# Patient Record
Sex: Female | Born: 1985 | Race: Black or African American | Hispanic: No | Marital: Single | Smoking: Never smoker
Health system: Southern US, Community
[De-identification: ages and names within clinical notes are randomized; demographics above are authoritative.]

## PROBLEM LIST (undated history)

## (undated) ENCOUNTER — Inpatient Hospital Stay (HOSPITAL_COMMUNITY): Payer: Self-pay

## (undated) DIAGNOSIS — I8390 Asymptomatic varicose veins of unspecified lower extremity: Secondary | ICD-10-CM

## (undated) DIAGNOSIS — O139 Gestational [pregnancy-induced] hypertension without significant proteinuria, unspecified trimester: Secondary | ICD-10-CM

## (undated) DIAGNOSIS — O09299 Supervision of pregnancy with other poor reproductive or obstetric history, unspecified trimester: Secondary | ICD-10-CM

## (undated) DIAGNOSIS — R569 Unspecified convulsions: Secondary | ICD-10-CM

## (undated) HISTORY — DX: Supervision of pregnancy with other poor reproductive or obstetric history, unspecified trimester: O09.299

---
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
## 2007-05-07 ENCOUNTER — Encounter (INDEPENDENT_AMBULATORY_CARE_PROVIDER_SITE_OTHER): Payer: Self-pay | Admitting: Nurse Practitioner

## 2007-05-15 ENCOUNTER — Encounter (INDEPENDENT_AMBULATORY_CARE_PROVIDER_SITE_OTHER): Payer: Self-pay | Admitting: Nurse Practitioner

## 2007-05-17 ENCOUNTER — Encounter: Payer: Self-pay | Admitting: Nurse Practitioner

## 2007-06-18 ENCOUNTER — Encounter (INDEPENDENT_AMBULATORY_CARE_PROVIDER_SITE_OTHER): Payer: Self-pay | Admitting: Nurse Practitioner

## 2009-09-19 ENCOUNTER — Emergency Department (HOSPITAL_COMMUNITY): Admission: EM | Admit: 2009-09-19 | Discharge: 2009-09-19 | Payer: Self-pay | Admitting: Family Medicine

## 2009-12-19 ENCOUNTER — Inpatient Hospital Stay (HOSPITAL_COMMUNITY): Admission: AD | Admit: 2009-12-19 | Discharge: 2009-12-21 | Payer: Self-pay | Admitting: Obstetrics & Gynecology

## 2009-12-19 ENCOUNTER — Ambulatory Visit: Payer: Self-pay | Admitting: Family Medicine

## 2009-12-19 IMAGING — US US OB COMP +14 WK
1 series · 14 of 18 positions shown · non-contrast
Comparison: none

OBSTETRICAL ULTRASOUND:
 This ultrasound exam was performed in the [HOSPITAL] Ultrasound Department.  The OB US report was generated in the AS system, and faxed to the ordering physician.  This report is also available in [HOSPITAL]?s AccessANYware and in [REDACTED] PACS.

[Series 1: us ob comp +14 wk · 14 of 18 slices shown]
[im 1/18]
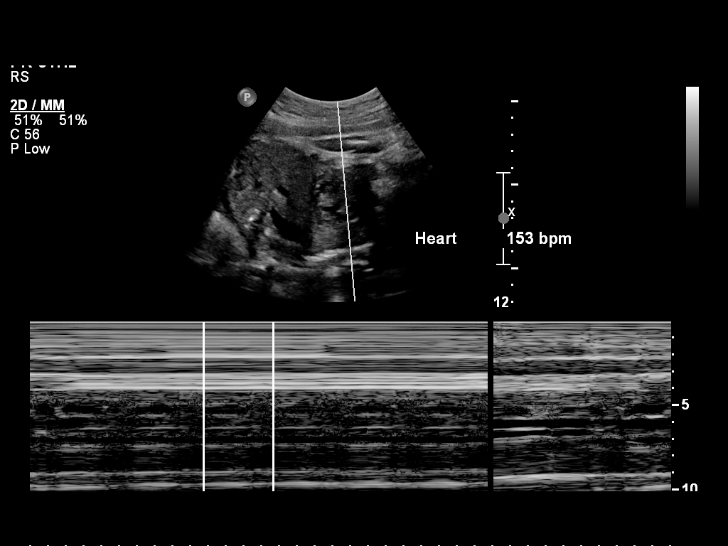
[im 2/18]
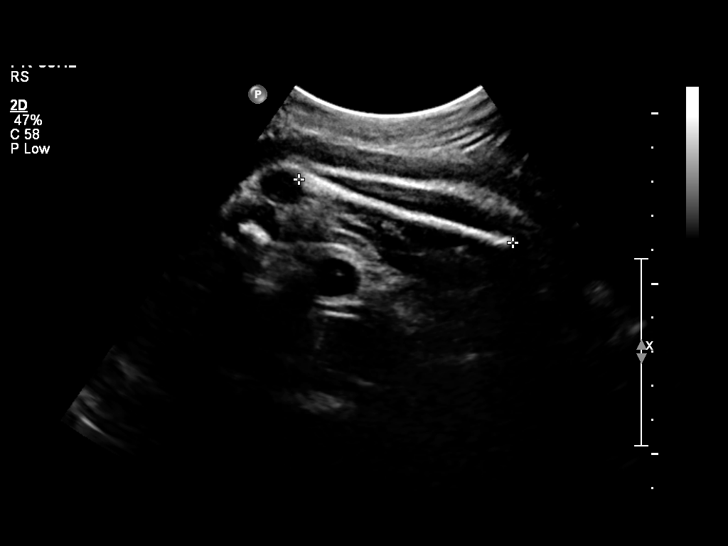
[im 4/18]
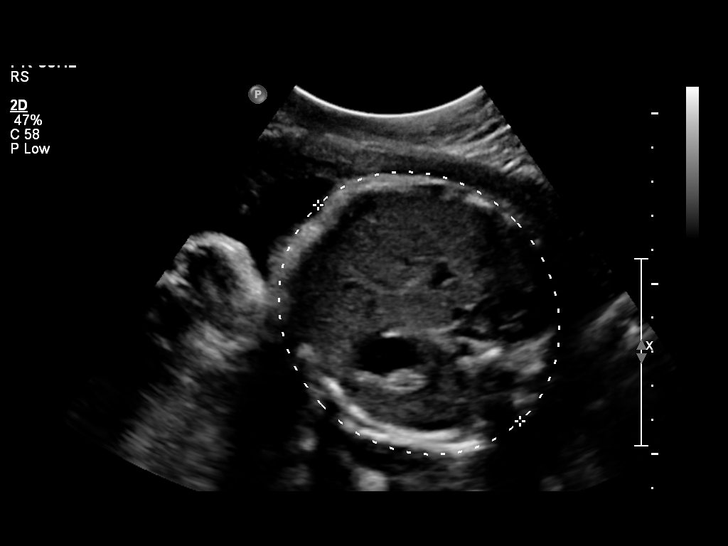
[im 5/18]
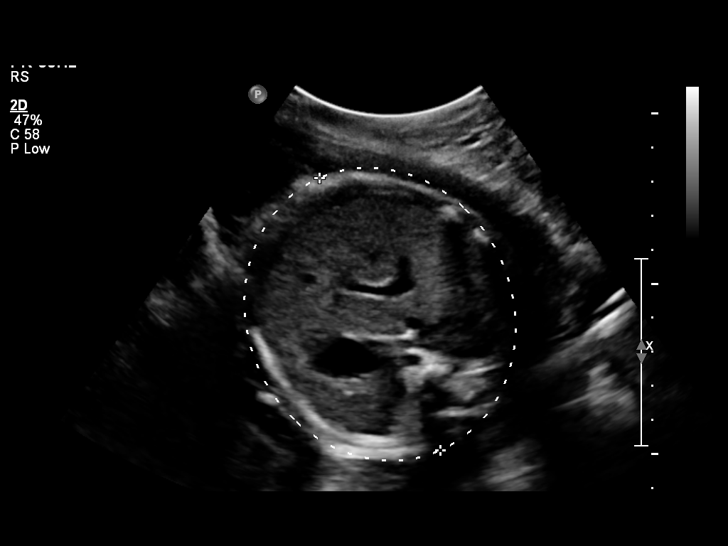
[im 6/18]
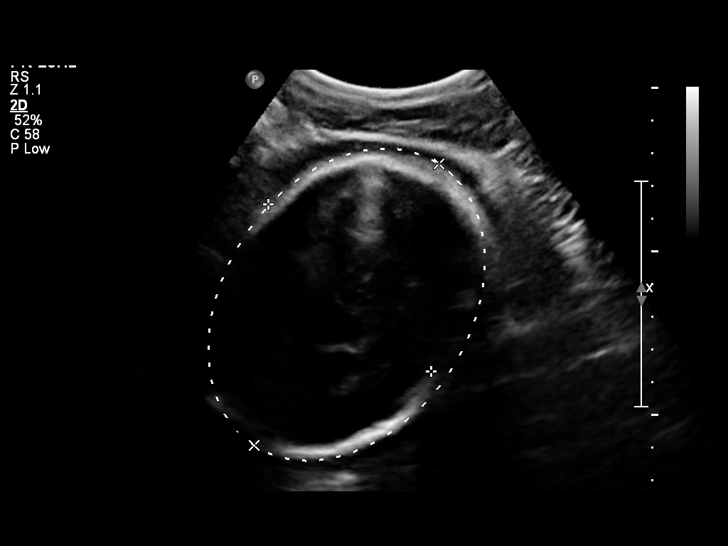
[im 8/18]
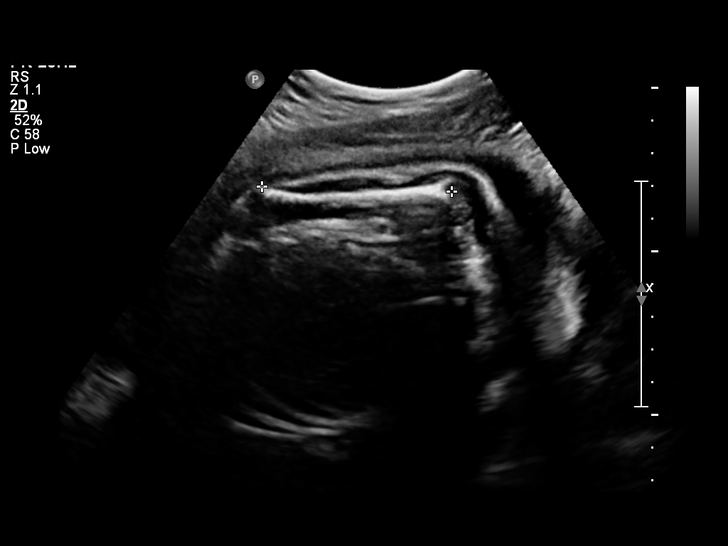
[im 9/18]
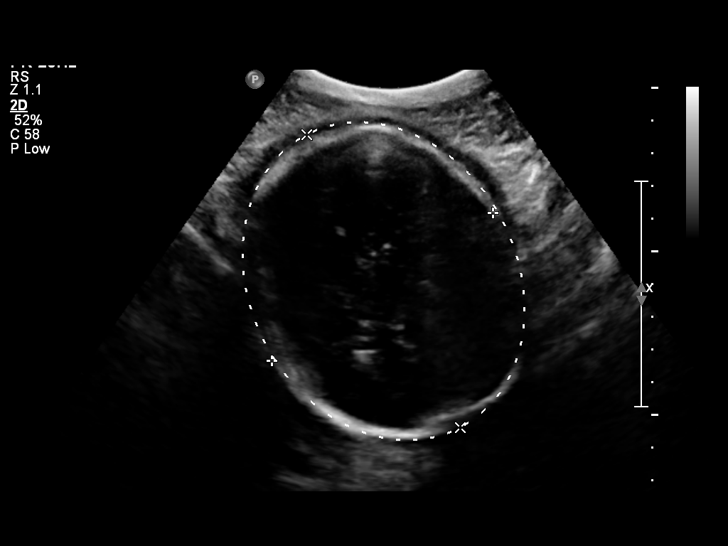
[im 10/18]
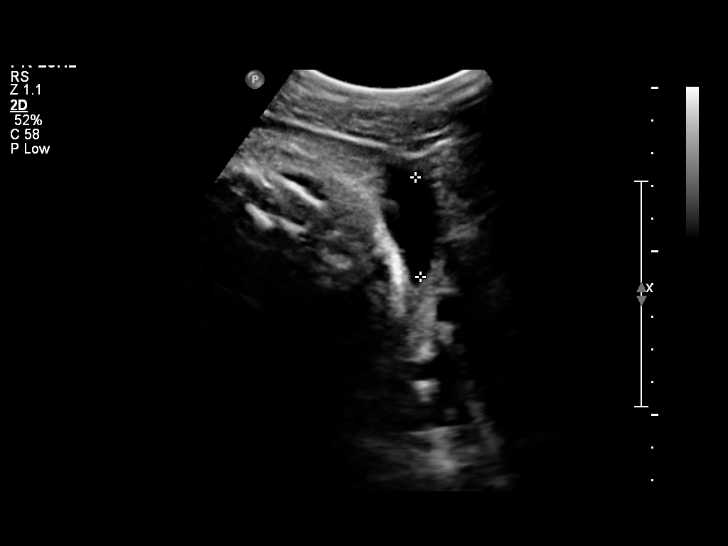
[im 11/18]
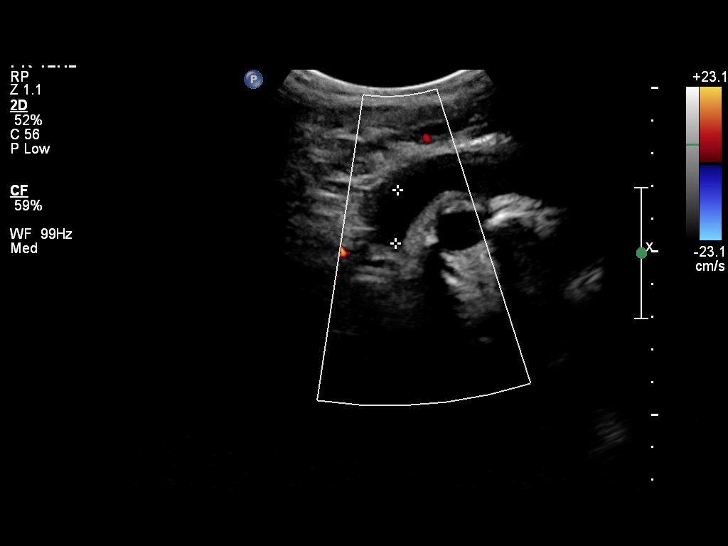
[im 13/18]
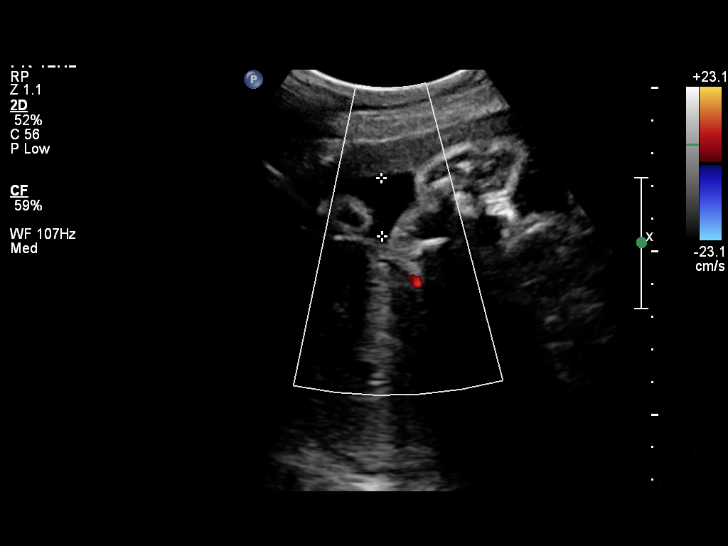
[im 14/18]
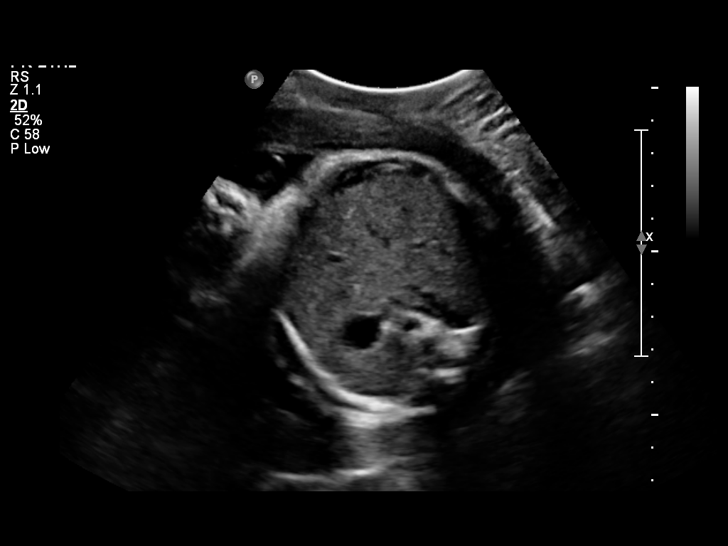
[im 15/18]
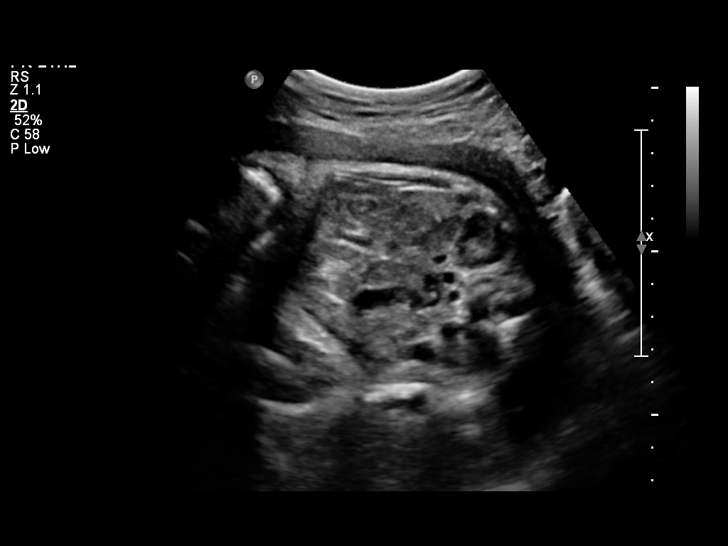
[im 17/18]
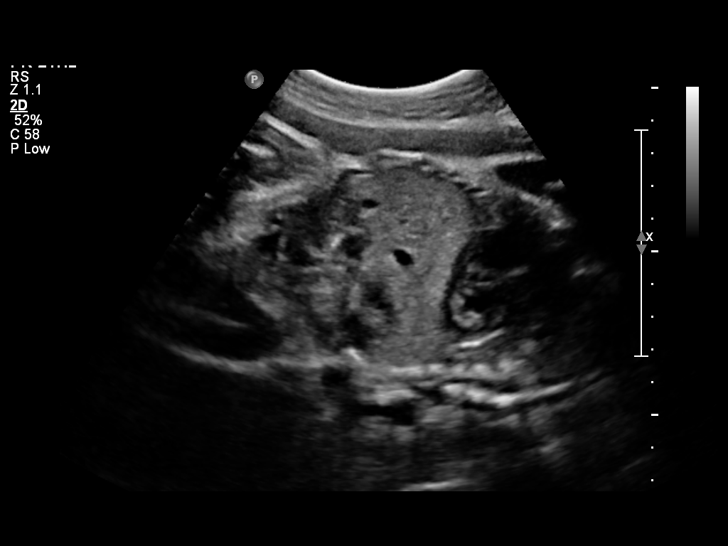
[im 18/18]
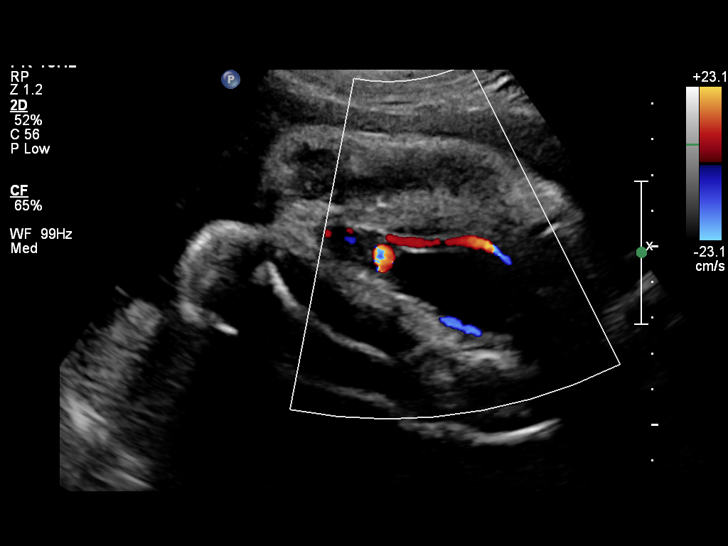

[14 of 18 positions shown; findings below may reference images not displayed]

IMPRESSION: See AS Obstetric US report.

## 2010-02-03 ENCOUNTER — Emergency Department (HOSPITAL_COMMUNITY): Admission: EM | Admit: 2010-02-03 | Discharge: 2010-02-03 | Payer: Self-pay | Admitting: Emergency Medicine

## 2010-08-17 LAB — CBC
MCHC: 31.5 g/dL (ref 30.0–36.0)
MCV: 68.9 fL — ABNORMAL LOW (ref 78.0–100.0)
RDW: 18.1 % — ABNORMAL HIGH (ref 11.5–15.5)

## 2010-08-17 LAB — DIFFERENTIAL
Basophils Absolute: 0.2 10*3/uL — ABNORMAL HIGH (ref 0.0–0.1)
Basophils Relative: 1 % (ref 0–1)
Eosinophils Absolute: 0 10*3/uL (ref 0.0–0.7)
Eosinophils Relative: 0 % (ref 0–5)
Monocytes Absolute: 0.6 10*3/uL (ref 0.1–1.0)
Neutrophils Relative %: 91 % — ABNORMAL HIGH (ref 43–77)

## 2010-08-17 LAB — STREP B DNA PROBE: Strep Group B Ag: POSITIVE

## 2010-08-17 LAB — RPR: RPR Ser Ql: NONREACTIVE

## 2010-08-17 LAB — URINE DRUGS OF ABUSE SCREEN W ALC, ROUTINE (REF LAB)
Benzodiazepines.: NEGATIVE
Creatinine,U: 194.2 mg/dL
Ethyl Alcohol: <10 mg/dL (ref <10)
Opiate Screen, Urine: NEGATIVE
Phencyclidine (PCP): NEGATIVE
Propoxyphene: NEGATIVE

## 2010-08-17 LAB — TYPE AND SCREEN
ABO/RH(D): A POS
Antibody Screen: NEGATIVE

## 2010-08-17 LAB — HEPATITIS B SURFACE ANTIGEN: Hepatitis B Surface Ag: NEGATIVE

## 2010-08-17 LAB — SICKLE CELL SCREEN: Sickle Cell Screen: NEGATIVE

## 2010-08-20 LAB — URINE CULTURE

## 2010-08-20 LAB — POCT URINALYSIS DIP (DEVICE)
Ketones, ur: NEGATIVE mg/dL
Specific Gravity, Urine: 1.025 (ref 1.005–1.030)
pH: 6 (ref 5.0–8.0)

## 2010-08-20 LAB — POCT PREGNANCY, URINE: Preg Test, Ur: POSITIVE

## 2011-11-08 ENCOUNTER — Encounter (HOSPITAL_COMMUNITY): Payer: Self-pay | Admitting: Anesthesiology

## 2011-11-08 ENCOUNTER — Encounter (HOSPITAL_COMMUNITY): Payer: Self-pay | Admitting: Radiology

## 2011-11-08 ENCOUNTER — Inpatient Hospital Stay (HOSPITAL_COMMUNITY)
Admission: AD | Admit: 2011-11-08 | Discharge: 2011-11-11 | DRG: 765 | Disposition: A | Discharge status: Hospice | Payer: MEDICAID | Source: Ambulatory Visit | Attending: Obstetrics & Gynecology | Admitting: Obstetrics & Gynecology

## 2011-11-08 ENCOUNTER — Inpatient Hospital Stay (HOSPITAL_COMMUNITY): Payer: Self-pay

## 2011-11-08 ENCOUNTER — Encounter (HOSPITAL_COMMUNITY)
Admission: AD | Disposition: A | Discharge status: Hospice | Payer: Self-pay | Source: Ambulatory Visit | Attending: Obstetrics & Gynecology

## 2011-11-08 ENCOUNTER — Inpatient Hospital Stay (HOSPITAL_COMMUNITY): Payer: Self-pay | Admitting: Anesthesiology

## 2011-11-08 DIAGNOSIS — D689 Coagulation defect, unspecified: Secondary | ICD-10-CM | POA: Diagnosis not present

## 2011-11-08 DIAGNOSIS — Z98891 History of uterine scar from previous surgery: Secondary | ICD-10-CM

## 2011-11-08 DIAGNOSIS — D696 Thrombocytopenia, unspecified: Secondary | ICD-10-CM

## 2011-11-08 DIAGNOSIS — O151 Eclampsia in labor: Secondary | ICD-10-CM

## 2011-11-08 DIAGNOSIS — O9913 Other diseases of the blood and blood-forming organs and certain disorders involving the immune mechanism complicating the puerperium: Secondary | ICD-10-CM | POA: Diagnosis not present

## 2011-11-08 DIAGNOSIS — O34219 Maternal care for unspecified type scar from previous cesarean delivery: Secondary | ICD-10-CM

## 2011-11-08 DIAGNOSIS — O0930 Supervision of pregnancy with insufficient antenatal care, unspecified trimester: Secondary | ICD-10-CM

## 2011-11-08 HISTORY — DX: Gestational (pregnancy-induced) hypertension without significant proteinuria, unspecified trimester: O13.9

## 2011-11-08 LAB — COMPREHENSIVE METABOLIC PANEL
ALT: 36 U/L — ABNORMAL HIGH (ref 0–35)
AST: 66 U/L — ABNORMAL HIGH (ref 0–37)
Albumin: 2.6 g/dL — ABNORMAL LOW (ref 3.5–5.2)
BUN: 12 mg/dL (ref 6–23)
Calcium: 8.5 mg/dL (ref 8.4–10.5)
Chloride: 104 mEq/L (ref 96–112)
Creatinine, Ser: 0.91 mg/dL (ref 0.50–1.10)
GFR calc non Af Amer: 88 mL/min — ABNORMAL LOW (ref >90)
Glucose, Bld: 92 mg/dL (ref 70–99)
Potassium: 3.7 mEq/L (ref 3.5–5.1)

## 2011-11-08 LAB — CBC
HCT: 29.2 % — ABNORMAL LOW (ref 36.0–46.0)
Hemoglobin: 9 g/dL — ABNORMAL LOW (ref 12.0–15.0)
MCH: 22.4 pg — ABNORMAL LOW (ref 26.0–34.0)
Platelets: 88 10*3/uL — ABNORMAL LOW (ref 150–400)

## 2011-11-08 LAB — GLUCOSE, CAPILLARY: Glucose-Capillary: 90 mg/dL (ref 70–99)

## 2011-11-08 LAB — TYPE AND SCREEN
ABO/RH(D): A POS
Antibody Screen: NEGATIVE

## 2011-11-08 SURGERY — Surgical Case
Anesthesia: Spinal | Site: Abdomen | Wound class: Clean Contaminated

## 2011-11-08 MED ORDER — OXYTOCIN 10 UNIT/ML IJ SOLN
40.0000 [IU] | INTRAVENOUS | Status: DC | PRN
  Administered 2011-11-08: 40 [IU] via INTRAVENOUS

## 2011-11-08 MED ORDER — LABETALOL HCL 5 MG/ML IV SOLN
20.0000 mg | Freq: Once | INTRAVENOUS | Status: AC
  Administered 2011-11-08: 20 mg via INTRAVENOUS

## 2011-11-08 MED ORDER — ONDANSETRON HCL 4 MG/2ML IJ SOLN
INTRAMUSCULAR | Status: AC
  Filled 2011-11-08: qty 2

## 2011-11-08 MED ORDER — HYDRALAZINE HCL 20 MG/ML IJ SOLN
10.0000 mg | INTRAMUSCULAR | Status: DC | PRN
  Administered 2011-11-09 (×2): 10 mg via INTRAVENOUS
  Filled 2011-11-08 (×2): qty 1

## 2011-11-08 MED ORDER — SODIUM CHLORIDE 0.9 % IJ SOLN: 3.0000 mL | INTRAMUSCULAR | Status: DC | PRN

## 2011-11-08 MED ORDER — LORAZEPAM 2 MG/ML IJ SOLN
2.0000 mg | Freq: Once | INTRAMUSCULAR | Status: DC
  Administered 2011-11-08: 2 mg via INTRAVENOUS

## 2011-11-08 MED ORDER — LANOLIN HYDROUS EX OINT: 1.0000 "application " | TOPICAL_OINTMENT | CUTANEOUS | Status: DC | PRN

## 2011-11-08 MED ORDER — LABETALOL HCL 5 MG/ML IV SOLN
INTRAVENOUS | Status: AC
  Filled 2011-11-08: qty 4

## 2011-11-08 MED ORDER — PHENYLEPHRINE 40 MCG/ML (10ML) SYRINGE FOR IV PUSH (FOR BLOOD PRESSURE SUPPORT)
PREFILLED_SYRINGE | INTRAVENOUS | Status: AC
  Filled 2011-11-08: qty 10

## 2011-11-08 MED ORDER — MAGNESIUM SULFATE 40 G IN LACTATED RINGERS - SIMPLE
2.0000 g/h | INTRAVENOUS | Status: AC
  Administered 2011-11-08 – 2011-11-09 (×2): 2 g/h via INTRAVENOUS
  Filled 2011-11-08 (×2): qty 500

## 2011-11-08 MED ORDER — WITCH HAZEL-GLYCERIN EX PADS: 1.0000 "application " | MEDICATED_PAD | CUTANEOUS | Status: DC | PRN

## 2011-11-08 MED ORDER — HYDROMORPHONE HCL PF 1 MG/ML IJ SOLN: 0.2500 mg | INTRAMUSCULAR | Status: DC | PRN

## 2011-11-08 MED ORDER — MEPERIDINE HCL 25 MG/ML IJ SOLN: 6.2500 mg | INTRAMUSCULAR | Status: DC | PRN

## 2011-11-08 MED ORDER — CEFAZOLIN SODIUM 1-5 GM-% IV SOLN
INTRAVENOUS | Status: DC | PRN
  Administered 2011-11-08: 2 g via INTRAVENOUS

## 2011-11-08 MED ORDER — DIBUCAINE 1 % RE OINT: 1.0000 "application " | TOPICAL_OINTMENT | RECTAL | Status: DC | PRN

## 2011-11-08 MED ORDER — DIPHENHYDRAMINE HCL 25 MG PO CAPS: 25.0000 mg | ORAL_CAPSULE | Freq: Four times a day (QID) | ORAL | Status: DC | PRN

## 2011-11-08 MED ORDER — DIPHENHYDRAMINE HCL 50 MG/ML IJ SOLN: 12.5000 mg | INTRAMUSCULAR | Status: DC | PRN

## 2011-11-08 MED ORDER — ONDANSETRON HCL 4 MG PO TABS: 4.0000 mg | ORAL_TABLET | ORAL | Status: DC | PRN

## 2011-11-08 MED ORDER — DIPHENHYDRAMINE HCL 50 MG/ML IJ SOLN: 25.0000 mg | INTRAMUSCULAR | Status: DC | PRN

## 2011-11-08 MED ORDER — BUPIVACAINE HCL (PF) 0.25 % IJ SOLN
INTRAMUSCULAR | Status: AC
  Filled 2011-11-08: qty 30

## 2011-11-08 MED ORDER — FENTANYL CITRATE 0.05 MG/ML IJ SOLN
INTRAMUSCULAR | Status: AC
  Filled 2011-11-08: qty 2

## 2011-11-08 MED ORDER — OXYTOCIN 20 UNITS IN LACTATED RINGERS INFUSION - SIMPLE
125.0000 mL/h | INTRAVENOUS | Status: AC
  Administered 2011-11-08 – 2011-11-09 (×2): 125 mL/h via INTRAVENOUS
  Filled 2011-11-08 (×2): qty 1000

## 2011-11-08 MED ORDER — SODIUM CHLORIDE 0.9 % IJ SOLN
3.0000 mL | Freq: Two times a day (BID) | INTRAMUSCULAR | Status: DC
  Administered 2011-11-09 – 2011-11-10 (×2): 3 mL via INTRAVENOUS

## 2011-11-08 MED ORDER — ZOLPIDEM TARTRATE 5 MG PO TABS: 5.0000 mg | ORAL_TABLET | Freq: Every evening | ORAL | Status: DC | PRN

## 2011-11-08 MED ORDER — BUPIVACAINE IN DEXTROSE 0.75-8.25 % IT SOLN
INTRATHECAL | Status: DC | PRN
  Administered 2011-11-08: 1.3 mL via INTRATHECAL

## 2011-11-08 MED ORDER — SODIUM CHLORIDE 0.9 % IV SOLN
INTRAVENOUS | Status: DC | PRN
  Administered 2011-11-08 (×2): via INTRAVENOUS

## 2011-11-08 MED ORDER — SODIUM CHLORIDE 0.9 % IJ SOLN
3.0000 mL | INTRAMUSCULAR | Status: DC | PRN
  Administered 2011-11-09: 3 mL via INTRAVENOUS

## 2011-11-08 MED ORDER — PROMETHAZINE HCL 25 MG/ML IJ SOLN: 6.2500 mg | INTRAMUSCULAR | Status: DC | PRN

## 2011-11-08 MED ORDER — PRENATAL MULTIVITAMIN CH
1.0000 | ORAL_TABLET | Freq: Every day | ORAL | Status: DC
  Administered 2011-11-09 – 2011-11-11 (×3): 1 via ORAL
  Filled 2011-11-08 (×3): qty 1

## 2011-11-08 MED ORDER — SODIUM CHLORIDE 0.9 % IV SOLN: 250.0000 mL | INTRAVENOUS | Status: DC

## 2011-11-08 MED ORDER — MORPHINE SULFATE 0.5 MG/ML IJ SOLN
INTRAMUSCULAR | Status: AC
  Filled 2011-11-08: qty 10

## 2011-11-08 MED ORDER — OXYCODONE-ACETAMINOPHEN 5-325 MG PO TABS
1.0000 | ORAL_TABLET | ORAL | Status: DC | PRN
  Administered 2011-11-09 – 2011-11-10 (×6): 1 via ORAL
  Filled 2011-11-08 (×7): qty 1

## 2011-11-08 MED ORDER — NALBUPHINE HCL 10 MG/ML IJ SOLN
5.0000 mg | INTRAMUSCULAR | Status: DC | PRN
  Filled 2011-11-08: qty 1

## 2011-11-08 MED ORDER — BUPIVACAINE HCL (PF) 0.25 % IJ SOLN
INTRAMUSCULAR | Status: DC | PRN
  Administered 2011-11-08: 10 mL

## 2011-11-08 MED ORDER — TETANUS-DIPHTH-ACELL PERTUSSIS 5-2.5-18.5 LF-MCG/0.5 IM SUSP
0.5000 mL | Freq: Once | INTRAMUSCULAR | Status: AC
  Administered 2011-11-09: 0.5 mL via INTRAMUSCULAR
  Filled 2011-11-08: qty 0.5

## 2011-11-08 MED ORDER — LORAZEPAM 2 MG/ML IJ SOLN
INTRAMUSCULAR | Status: AC
  Filled 2011-11-08: qty 1

## 2011-11-08 MED ORDER — IBUPROFEN 600 MG PO TABS
600.0000 mg | ORAL_TABLET | Freq: Four times a day (QID) | ORAL | Status: DC
  Administered 2011-11-09 – 2011-11-11 (×5): 600 mg via ORAL
  Filled 2011-11-08 (×6): qty 1

## 2011-11-08 MED ORDER — SIMETHICONE 80 MG PO CHEW
80.0000 mg | CHEWABLE_TABLET | ORAL | Status: DC | PRN
  Administered 2011-11-09 (×3): 80 mg via ORAL

## 2011-11-08 MED ORDER — NALOXONE HCL 0.4 MG/ML IJ SOLN: 0.4000 mg | INTRAMUSCULAR | Status: DC | PRN

## 2011-11-08 MED ORDER — MAGNESIUM HYDROXIDE 400 MG/5ML PO SUSP
30.0000 mL | ORAL | Status: DC | PRN
  Filled 2011-11-08: qty 30

## 2011-11-08 MED ORDER — ONDANSETRON HCL 4 MG/2ML IJ SOLN: 4.0000 mg | INTRAMUSCULAR | Status: DC | PRN

## 2011-11-08 MED ORDER — MEPERIDINE HCL 25 MG/ML IJ SOLN
6.2500 mg | INTRAMUSCULAR | Status: DC | PRN
  Administered 2011-11-08 (×2): 12.5 mg via INTRAVENOUS

## 2011-11-08 MED ORDER — HYDRALAZINE HCL 20 MG/ML IJ SOLN
10.0000 mg | Freq: Once | INTRAMUSCULAR | Status: AC
  Administered 2011-11-08: 10 mg via INTRAVENOUS
  Filled 2011-11-08: qty 1

## 2011-11-08 MED ORDER — SENNOSIDES-DOCUSATE SODIUM 8.6-50 MG PO TABS
2.0000 | ORAL_TABLET | Freq: Every day | ORAL | Status: DC
  Administered 2011-11-08 – 2011-11-09 (×2): 2 via ORAL

## 2011-11-08 MED ORDER — LORAZEPAM 2 MG/ML IJ SOLN
2.0000 mg | Freq: Once | INTRAMUSCULAR | Status: DC
  Administered 2011-11-08: 2 mg

## 2011-11-08 MED ORDER — OXYTOCIN 10 UNIT/ML IJ SOLN
INTRAMUSCULAR | Status: AC
  Filled 2011-11-08: qty 4

## 2011-11-08 MED ORDER — OXYTOCIN 10 UNIT/ML IJ SOLN
INTRAMUSCULAR | Status: AC
  Filled 2011-11-08: qty 2

## 2011-11-08 MED ORDER — KETOROLAC TROMETHAMINE 60 MG/2ML IM SOLN: 60.0000 mg | Freq: Once | INTRAMUSCULAR | Status: AC | PRN

## 2011-11-08 MED ORDER — ONDANSETRON HCL 4 MG/2ML IJ SOLN: 4.0000 mg | Freq: Three times a day (TID) | INTRAMUSCULAR | Status: DC | PRN

## 2011-11-08 MED ORDER — DIPHENHYDRAMINE HCL 25 MG PO CAPS: 25.0000 mg | ORAL_CAPSULE | ORAL | Status: DC | PRN

## 2011-11-08 MED ORDER — MENTHOL 3 MG MT LOZG: 1.0000 | LOZENGE | OROMUCOSAL | Status: DC | PRN

## 2011-11-08 MED ORDER — MEPERIDINE HCL 25 MG/ML IJ SOLN
INTRAMUSCULAR | Status: AC
  Administered 2011-11-08: 12.5 mg via INTRAVENOUS
  Filled 2011-11-08: qty 1

## 2011-11-08 MED ORDER — SODIUM CHLORIDE 0.9 % IR SOLN
Status: DC | PRN
  Administered 2011-11-08: 1000 mL

## 2011-11-08 MED ORDER — MORPHINE SULFATE (PF) 0.5 MG/ML IJ SOLN
INTRAMUSCULAR | Status: DC | PRN
  Administered 2011-11-08: .1 mg via INTRATHECAL

## 2011-11-08 MED ORDER — FENTANYL CITRATE 0.05 MG/ML IJ SOLN
INTRAMUSCULAR | Status: DC | PRN
  Administered 2011-11-08: 12.5 ug via INTRATHECAL

## 2011-11-08 SURGICAL SUPPLY — 35 items
CHLORAPREP W/TINT 26ML (MISCELLANEOUS) ×1 IMPLANT
CLOTH BEACON ORANGE TIMEOUT ST (SAFETY) ×2 IMPLANT
DRESSING TELFA 8X3 (GAUZE/BANDAGES/DRESSINGS) ×1 IMPLANT
DRSG COVADERM 4X10 (GAUZE/BANDAGES/DRESSINGS) ×1 IMPLANT
ELECT REM PT RETURN 9FT ADLT (ELECTROSURGICAL) ×2
ELECTRODE REM PT RTRN 9FT ADLT (ELECTROSURGICAL) ×1 IMPLANT
EXTRACTOR VACUUM M CUP 4 TUBE (SUCTIONS) IMPLANT
GAUZE SPONGE 4X4 12PLY STRL LF (GAUZE/BANDAGES/DRESSINGS) ×2 IMPLANT
GLOVE BIO SURGEON STRL SZ7 (GLOVE) ×2 IMPLANT
GLOVE BIOGEL PI IND STRL 7.0 (GLOVE) ×2 IMPLANT
GLOVE BIOGEL PI INDICATOR 7.0 (GLOVE) ×2
GOWN PREVENTION PLUS LG XLONG (DISPOSABLE) ×5 IMPLANT
KIT ABG SYR 3ML LUER SLIP (SYRINGE) ×1 IMPLANT
NDL HYPO 25X5/8 SAFETYGLIDE (NEEDLE) ×1 IMPLANT
NEEDLE HYPO 22GX1.5 SAFETY (NEEDLE) ×2 IMPLANT
NEEDLE HYPO 25X5/8 SAFETYGLIDE (NEEDLE) ×2 IMPLANT
NS IRRIG 1000ML POUR BTL (IV SOLUTION) ×2 IMPLANT
PACK C SECTION WH (CUSTOM PROCEDURE TRAY) ×2 IMPLANT
PAD ABD 7.5X8 STRL (GAUZE/BANDAGES/DRESSINGS) ×2 IMPLANT
RTRCTR C-SECT PINK 25CM LRG (MISCELLANEOUS) ×1 IMPLANT
SLEEVE SCD COMPRESS KNEE LRG (MISCELLANEOUS) IMPLANT
SLEEVE SCD COMPRESS KNEE MED (MISCELLANEOUS) ×1 IMPLANT
STAPLER VISISTAT 35W (STAPLE) IMPLANT
SUT MNCRL 0 VIOLET CTX 36 (SUTURE) ×2 IMPLANT
SUT MONOCRYL 0 CTX 36 (SUTURE) ×2
SUT PDS AB 0 CT1 27 (SUTURE) IMPLANT
SUT VIC AB 0 CT1 36 (SUTURE) ×4 IMPLANT
SUT VIC AB 2-0 CT1 27 (SUTURE)
SUT VIC AB 2-0 CT1 TAPERPNT 27 (SUTURE) IMPLANT
SUT VIC AB 4-0 KS 27 (SUTURE) ×2 IMPLANT
SYR CONTROL 10ML LL (SYRINGE) ×2 IMPLANT
TAPE CLOTH SURG 4X10 WHT LF (GAUZE/BANDAGES/DRESSINGS) ×1 IMPLANT
TOWEL OR 17X24 6PK STRL BLUE (TOWEL DISPOSABLE) ×4 IMPLANT
TRAY FOLEY CATH 14FR (SET/KITS/TRAYS/PACK) ×2 IMPLANT
WATER STERILE IRR 1000ML POUR (IV SOLUTION) ×2 IMPLANT

## 2011-11-08 NOTE — H&P (Addendum)
Patient with no prenatal care came in to MAU with seizures, BP 180s/100, fell backwards and hit her head. Given Ativan, magnesium sulfate, labetalol. CT scan to evaluate head injury was negative.  Exam revealed ~ 30 week fetus, cervix long/thick/closed.  FHR reassuring on bedside ultrasound. Will proceed with cesarean delivery, patient was unable to sign consent so verbal consent obtained.  Jaynie Collins, M.D. 11/08/2011 3:42 PM

## 2011-11-08 NOTE — Anesthesia Postprocedure Evaluation (Signed)
Anesthesia Post Note  Patient: [name] [name]  Procedure(s) Performed: Procedure(s) (LRB): CESAREAN SECTION (N/A)  Anesthesia type: Spinal  Patient location: PACU  Post pain: Pain level controlled  Post assessment: Post-op Vital signs reviewed  Last Vitals:  Filed Vitals:   11/08/11 1755  BP: 181/112  Pulse: 62  Temp:   Resp: 16    Post vital signs: Reviewed  Level of consciousness: awake  Complications: No apparent anesthesia complications

## 2011-11-08 NOTE — Anesthesia Preprocedure Evaluation (Signed)
Anesthesia Evaluation  Patient identified by MRN, date of birth, ID band Patient awake    Reviewed: Allergy & Precautions, H&P , NPO status , Patient's Chart, lab work & pertinent test results  Airway Mallampati: II TM Distance: >3 FB Neck ROM: full    Dental No notable dental hx.    Pulmonary neg pulmonary ROS,    Pulmonary exam normal       Cardiovascular negative cardio ROS  Rhythm:regular Rate:Normal     Neuro/Psych negative neurological ROS  negative psych ROS   GI/Hepatic negative GI ROS, Neg liver ROS,   Endo/Other  negative endocrine ROS  Renal/GU negative Renal ROS  negative genitourinary   Musculoskeletal negative musculoskeletal ROS (+)   Abdominal Normal abdominal exam  (+)   Peds negative pediatric ROS (+)  Hematology negative hematology ROS (+)   Anesthesia Other Findings   Reproductive/Obstetrics (+) Pregnancy                           Anesthesia Physical Anesthesia Plan  ASA: III and Emergent  Anesthesia Plan: Spinal   Post-op Pain Management:    Induction:   Airway Management Planned:   Additional Equipment:   Intra-op Plan:   Post-operative Plan:   Informed Consent: I have reviewed the patients History and Physical, chart, labs and discussed the procedure including the risks, benefits and alternatives for the proposed anesthesia with the patient or authorized representative who has indicated his/her understanding and acceptance.     Plan Discussed with: CRNA and Surgeon  Anesthesia Plan Comments:         Anesthesia Quick Evaluation

## 2011-11-08 NOTE — Transfer of Care (Signed)
Immediate Anesthesia Transfer of Care Note  Patient: [name] [name]  Procedure(s) Performed: Procedure(s) (LRB): CESAREAN SECTION (N/A)  Patient Location: PACU  Anesthesia Type: Spinal  Level of Consciousness: sedated  Airway & Oxygen Therapy: Patient Spontanous Breathing  Post-op Assessment: Report given to PACU RN and Post -op Vital signs reviewed and stable  Post vital signs: stable  Complications: No apparent anesthesia complications

## 2011-11-08 NOTE — Op Note (Signed)
[name] [name] PROCEDURE DATE: 11/08/2011  PREOPERATIVE DIAGNOSIS: Intrauterine pregnancy at around [redacted] weeks gestation; no prenatal care; eclampsia  POSTOPERATIVE DIAGNOSIS: The same  PROCEDURE: Primary Low Transverse Cesarean Section  SURGEON:  Dr. Jaynie Collins  ANESTHESIOLOGIST: Dr. Leilani Able  INDICATIONS: [name] [name] is a 26 y.o. G2P0102 at around [redacted] weeks GA with no prenatal care who presented to MAU with seizures and severe range BP concerning for eclampsia.   She fell backwards and hit her head; she received magnesium sulfate and Ativan and had reassuring FHR on bedside ultrasound.  CT scan showed no acute bleeding in the head, but showed acute hypertensive encephalopathy/PRES; no cervical spine fractures.  Her BP was in the 180s/100s, labs showed elevated LFTs, and platelet count of 88K. Her cervix was closed and she was remote from vaginal delivery. She taken for urgent cesarean section for management.  Patient was verbally consented for procedure, she was unable to consent in written form.  There was not enough time to administer corticosteroids. Discussed clinical situation with the patient's sister prior to surgery.  FINDINGS:  Viable female infant in cephalic presentation; estimated to be about 28-[redacted] weeks gestation by neonatologist.  Apgars 5 and 8, weight, 2 pounds and 13.9 ounces.  Arterial cord pH 7.21. Clear amniotic fluid.  Intact small placenta, three vessel cord.  Normal uterus, fallopian tubes and ovaries bilaterally.  ANESTHESIA: Spinal INTRAVENOUS FLUIDS: 800 ml ESTIMATED BLOOD LOSS: 600 ml URINE OUTPUT:  100 ml SPECIMENS: Placenta sent to pathology COMPLICATIONS: None immediate  PROCEDURE IN DETAIL:  The patient preoperatively received intravenous antibiotics and had sequential compression devices applied to her lower extremities.  She was then taken to the operating room where spinal anesthesia was administered and was found to be adequate. She was then  placed in a dorsal supine position with a leftward tilt, and prepped and draped in a sterile manner.  A foley catheter was placed into her bladder and attached to constant gravity.  After an adequate timeout was performed, a Pfannenstiel skin incision was made with scalpel and carried through to the underlying layer of fascia. The fascia was incised in the midline, and this incision was extended bilaterally using the Mayo scissors.  Kocher clamps were applied to the superior aspect of the fascial incision and the underlying rectus muscles were dissected off bluntly. A similar process was carried out on the inferior aspect of the fascial incision. The rectus muscles were separated in the midline bluntly and the peritoneum was entered bluntly. Attention was turned to the lower uterine segment where a low transverse hysterotomy was made with a scalpel and extended bilaterally bluntly.  The infant was successfully delivered, the cord was clamped and cut and the infant was handed over to awaiting neonatology team. Uterine massage was then administered, and the placenta delivered intact with a three-vessel cord. The uterus was then cleared of clot and debris.  The hysterotomy was closed with 0 Monocryl in a running locked fashion, and an imbricating layer was also placed with a 0 Monocryl suture. The pelvis was cleared of all clot and debris. Hemostasis was confirmed on all surfaces.  The peritoneum and the muscles were reapproximated using 0 Monocryl interrupted stitches. The fascia was then closed using 0 Vicryl  in a running fashion.  The subcutaneous layer was irrigated, and the skin was closed with a 4-0 Vicryl subcuticular stitch. The patient tolerated the procedure well. Sponge, lap, instrument and needle counts were correct x 2.  She was taken to  the recovery room in stable condition.

## 2011-11-08 NOTE — MAU Note (Signed)
Pt states she came to hospital because of vomiting. Upon arrival patient had a seizure in lobby. Rapid response called and caring for patient. Dr Macon Large here with pt. IV started on right anticubital with mag bolus at 6 gram.

## 2011-11-08 NOTE — Anesthesia Procedure Notes (Signed)
Spinal  Patient location during procedure: OR Start time: 11/08/2011 3:57 PM End time: 11/08/2011 4:01 PM Staffing Anesthesiologist: Sandrea Hughs Performed by: anesthesiologist  Preanesthetic Checklist Completed: patient identified, site marked, surgical consent, pre-op evaluation, timeout performed, IV checked, risks and benefits discussed and monitors and equipment checked Spinal Block Patient position: right lateral decubitus Prep: DuraPrep Patient monitoring: heart rate, cardiac monitor, continuous pulse ox and blood pressure Approach: midline Location: L3-4 Injection technique: single-shot Needle Needle type: Sprotte  Needle gauge: 24 G Needle length: 9 cm Assessment Sensory level: T4

## 2011-11-08 NOTE — MAU Note (Signed)
Pt taken directly to OR from CT scan, no prep done, verbal consent done in MAU per Dr. Macon Large

## 2011-11-08 NOTE — Significant Event (Addendum)
Rapid Response Event Note  Overview: Time Called: 1430 Arrival Time: 1432 Event Type: Neurologic  Initial Focused Assessment: Called to MAU lobby for pt. actively having a grand mal siezure. Upon arrival, pt was obtunded, confused and combative when any treatment was initiated. BP elevated and L) AC PIV obtained while in lobby.  PEERL at 3cm. Unable to assess orientation d/t pt's condition. Pt was presumed pregnant because she signed in as (unknown gestation), pregnant with c/o "throwing up blood".     Interventions: 2nd PIV obtained, a total of 4mg  IV Ativan and 6gram load of Magnesium given. 20mg  IV labetalol given IV for continued elevated BP's. Pt then sent to CT scan for evaluation of head and neck.  Once cleared, pt sent for emergent C/S d/t eclampsia.           Tia Alert A

## 2011-11-08 NOTE — Progress Notes (Signed)
I called Ms. Decoster's supervisor, Mr. Cory Munch,  at [facility] [employer] [phone]) to inform him that patient is admitted and underwent emergent surgery.  He said she would need to bring paperwork from the hospital to prove she was hospitalized, no specific form needs to be filled out prior to discharge.    I also called patient's sister Hale Bogus [phone]) to inform her that patient was stable after surgery and will be hospitalized in the Adult AICU. Baby will be in the NICU.  Jaynie Collins, M.D. 11/08/2011 5:49 PM

## 2011-11-08 NOTE — MAU Note (Signed)
Pt transported to CT per bed. ( with c-collar  inplace)

## 2011-11-08 NOTE — OR Nursing (Signed)
Uterus massaged by S. Makel Mcmann RN. Two tubes of cord blood sent to lab.  

## 2011-11-09 ENCOUNTER — Encounter (HOSPITAL_COMMUNITY): Payer: Self-pay | Admitting: *Deleted

## 2011-11-09 LAB — COMPREHENSIVE METABOLIC PANEL
AST: 82 U/L — ABNORMAL HIGH (ref 0–37)
Alkaline Phosphatase: 88 U/L (ref 39–117)
BUN: 9 mg/dL (ref 6–23)
BUN: 6 mg/dL (ref 6–23)
CO2: 24 mEq/L (ref 19–32)
Chloride: 103 mEq/L (ref 96–112)
Chloride: 101 mEq/L (ref 96–112)
Creatinine, Ser: 0.82 mg/dL (ref 0.50–1.10)
Creatinine, Ser: 0.89 mg/dL (ref 0.50–1.10)
GFR calc Af Amer: >90 mL/min (ref >90)
GFR calc Af Amer: >90 mL/min (ref >90)
GFR calc non Af Amer: >90 mL/min (ref >90)
GFR calc non Af Amer: >90 mL/min (ref >90)
Glucose, Bld: 101 mg/dL — ABNORMAL HIGH (ref 70–99)
Glucose, Bld: 112 mg/dL — ABNORMAL HIGH (ref 70–99)
Potassium: 3.3 mEq/L — ABNORMAL LOW (ref 3.5–5.1)
Total Bilirubin: 0.7 mg/dL (ref 0.3–1.2)
Total Bilirubin: 0.6 mg/dL (ref 0.3–1.2)

## 2011-11-09 LAB — CBC
HCT: 27.9 % — ABNORMAL LOW (ref 36.0–46.0)
HCT: 29.2 % — ABNORMAL LOW (ref 36.0–46.0)
Hemoglobin: 8.7 g/dL — ABNORMAL LOW (ref 12.0–15.0)
Hemoglobin: 9.1 g/dL — ABNORMAL LOW (ref 12.0–15.0)
MCH: 22.2 pg — ABNORMAL LOW (ref 26.0–34.0)
MCV: 71.7 fL — ABNORMAL LOW (ref 78.0–100.0)
MCV: 71.4 fL — ABNORMAL LOW (ref 78.0–100.0)
RBC: 4.09 MIL/uL (ref 3.87–5.11)
WBC: 11.8 10*3/uL — ABNORMAL HIGH (ref 4.0–10.5)

## 2011-11-09 LAB — MAGNESIUM: Magnesium: 5.4 mg/dL — ABNORMAL HIGH (ref 1.5–2.5)

## 2011-11-09 LAB — RAPID URINE DRUG SCREEN, HOSP PERFORMED: Opiates: POSITIVE — AB

## 2011-11-09 LAB — RUBELLA SCREEN: Rubella: 41.7 IU/mL — ABNORMAL HIGH

## 2011-11-09 MED ORDER — LABETALOL HCL 200 MG PO TABS
200.0000 mg | ORAL_TABLET | Freq: Two times a day (BID) | ORAL | Status: DC
  Administered 2011-11-09 (×2): 200 mg via ORAL
  Filled 2011-11-09 (×3): qty 1

## 2011-11-09 MED ORDER — FUROSEMIDE 10 MG/ML IJ SOLN
20.0000 mg | Freq: Once | INTRAMUSCULAR | Status: AC
  Administered 2011-11-09: 20 mg via INTRAVENOUS
  Filled 2011-11-09: qty 2

## 2011-11-09 MED ORDER — MAGNESIUM SULFATE 40 G IN LACTATED RINGERS - SIMPLE
2.0000 g/h | INTRAVENOUS | Status: DC
  Administered 2011-11-10: 2 g/h via INTRAVENOUS
  Filled 2011-11-09 (×2): qty 500

## 2011-11-09 MED ORDER — LACTATED RINGERS IV SOLN
INTRAVENOUS | Status: DC
  Administered 2011-11-09 – 2011-11-10 (×2): via INTRAVENOUS

## 2011-11-09 NOTE — Progress Notes (Signed)
PSYCHOSOCIAL ASSESSMENT ~ MATERNAL/CHILD Name:   [name] [name]       Age: 24 day    Referral Date: 11/08/2011   Reason/Source:NICU admission/mom contemplating adoption I. FAMILY/HOME ENVIRONMENT Child's Legal Guardian Parent(s)     Name:  [name] [name]  DOB: 06/12/1985    Age: 75 Address:  [address], [place] [address]  Other Household Members/Support Persons   Maternal aunt   [name] age 6  C.   Other Support:  Sibling's FOB and that side of the family  PSYCHOSOCIAL DATA Information Source X Patient Interview    Surveyor, quantity and Community Resources X Employment-[employer]   X Self Pay  X Food Stamps      X WIC   Cultural and Environment Information: Cultural Issues Impacting Care: N/A STRENGTHS X Supportive family/friends    RISK FACTORS AND CURRENT PROBLEMS        NICU admission            Financial Resources              Mother considering adoption            SOCIAL WORK ASSESSMENT  Met with MOB for session.  She was pleasant, communicative and appreciative.  She understands the difficulties of having a NICU baby.  She spoke with her care team about possible adoption because she is concerned about being able to financially care for her baby.  She reports not having any supplies for her new son.  The FOB is not involved.  The FOB of her two year old is supportive and involved, and in fact, he takes MOB to work at times when needed.  She has known the family of the sister's FOB for about 5 years. The paternal relatives of the 63 year old's father is very supportive and involved.  The sister's paternal grandmother watches her while mom works.  MOB reports living with her sister who works as a Lawyer.  Mom's sister called patient's job and informed them she was in the hospital.  MOB reports she did not disclose the reason for being in the hospital.   MOB reported that she knew she was pregnant after taking a home pregnancy test and getting confirmation from the [facility]  Department.  She did not tell the FOB.  She had not disclosed her pregnancy to anyone.  She was at home with her sister and sister's best friend when she began having seizures and was taken to the hospital when she delivered.  The sister was initially upset over the pregnancy due to concern about being able to financially raise the child.  When asked what the sibling's family would think, MOB feels they would be initially shocked, but happy and supportive.  We discussed resources that are available to assist NICU mom's and mom's in need.   MOB was open to hearing about resources available.  Encouraged her to talk with people she trusts as well as our staff and care team on how we can support her in making an informed decision.  Mom was open to continuing to look at resources available and discuss the situation further.  She smiled throughout the session and she was receptive to information available that would help her decision making during this time.  I discussed how the care team and SW staff will continue to be available to support her and her baby during hospiltalization.  Mom reported no concerns with stress, anxiety or depression.  She was appreciative  of visit.   SOCIAL WORK PLAN X No Further Intervention Required/No Barriers to Discharge X Patient/Family Education: NICU brochure  Staci Acosta, MSW LCSW, 11/09/2011, 6:51pm

## 2011-11-09 NOTE — Progress Notes (Signed)
Subjective: Postpartum Day/Post operative 1: Cesarean Delivery Patient reports no complaints at all.    Objective: Vital signs in last 24 hours: Temp:  [97.1 F (36.2 C)-98 F (36.7 C)] 97.7 F (36.5 C) (06/09 0800) Pulse Rate:  [62-94] 90  (06/09 1000) Resp:  [16-22] 20  (06/09 1000) BP: (141-185)/(85-123) 158/105 mmHg (06/09 1000) SpO2:  [98 %-100 %] 99 % (06/09 1000) Weight:  [179 lb 1.6 oz (81.239 kg)-180 lb (81.647 kg)] 180 lb (81.647 kg) (06/09 0500)  Physical Exam:  General: alert, cooperative and no distress Lochia: appropriate Uterine Fundus: firm Incision: normal DVT Evaluation: No evidence of DVT seen on physical exam. Negative Homan's sign.   Basename 11/09/11 0515 11/08/11 1455  HGB 8.7* 9.0*  HCT 27.9* 29.2*    Assessment/Plan: Status post Cesarean section. Postoperative course complicated by thrombocytopenia, hypertension  Check Mag level, Lasix IV for diuresis, add labetalol.  Recheck labs this evening Jaice Lague H 11/09/2011, 10:10 AM

## 2011-11-09 NOTE — Progress Notes (Signed)
Pt talking with her sister and friend about adoptions and all are very emotional

## 2011-11-09 NOTE — Progress Notes (Signed)
Social Worker in with pt.

## 2011-11-09 NOTE — Anesthesia Postprocedure Evaluation (Addendum)
  Anesthesia Post-op Note  Patient: [name] [name]  Procedure(s) Performed: Procedure(s) (LRB): CESAREAN SECTION (N/A)  Patient Location: A-ICU  Anesthesia Type: Spinal  Level of Consciousness: awake, alert  and oriented  Airway and Oxygen Therapy: Patient Spontanous Breathing  Post-op Pain: mild  Post-op Assessment: Patient's Cardiovascular Status Stable and Respiratory Function Stable  Post-op Vital Signs: stable  Complications: No apparent anesthesia complications.  Pt Moving all extremities without complaints.

## 2011-11-09 NOTE — Progress Notes (Signed)
Pt started on po Labetalol and 20mg  IV lasix given.

## 2011-11-09 NOTE — Addendum Note (Signed)
Addendum  created 11/09/11 1217 by Earmon Phoenix, CRNA   Modules edited:Notes Section

## 2011-11-10 LAB — COMPREHENSIVE METABOLIC PANEL
AST: 37 U/L (ref 0–37)
Albumin: 2.3 g/dL — ABNORMAL LOW (ref 3.5–5.2)
Alkaline Phosphatase: 77 U/L (ref 39–117)
BUN: 6 mg/dL (ref 6–23)
CO2: 26 mEq/L (ref 19–32)
Chloride: 98 mEq/L (ref 96–112)
GFR calc non Af Amer: 87 mL/min — ABNORMAL LOW (ref >90)
Potassium: 3.9 mEq/L (ref 3.5–5.1)
Total Bilirubin: 0.3 mg/dL (ref 0.3–1.2)

## 2011-11-10 LAB — CBC
HCT: 25.6 % — ABNORMAL LOW (ref 36.0–46.0)
HCT: 26.4 % — ABNORMAL LOW (ref 36.0–46.0)
MCV: 71.1 fL — ABNORMAL LOW (ref 78.0–100.0)
MCV: 71.5 fL — ABNORMAL LOW (ref 78.0–100.0)
RBC: 3.69 MIL/uL — ABNORMAL LOW (ref 3.87–5.11)
RDW: 18.1 % — ABNORMAL HIGH (ref 11.5–15.5)
RDW: 18.3 % — ABNORMAL HIGH (ref 11.5–15.5)
WBC: 11.5 10*3/uL — ABNORMAL HIGH (ref 4.0–10.5)
WBC: 13.2 10*3/uL — ABNORMAL HIGH (ref 4.0–10.5)

## 2011-11-10 LAB — MAGNESIUM: Magnesium: 5 mg/dL — ABNORMAL HIGH (ref 1.5–2.5)

## 2011-11-10 MED ORDER — TRIAMTERENE-HCTZ 37.5-25 MG PO TABS
2.0000 | ORAL_TABLET | Freq: Every day | ORAL | Status: DC
  Administered 2011-11-10 – 2011-11-11 (×2): 2 via ORAL
  Filled 2011-11-10 (×3): qty 2

## 2011-11-10 MED ORDER — TRIAMTERENE-HCTZ 75-50 MG PO TABS
1.0000 | ORAL_TABLET | Freq: Every day | ORAL | Status: DC
  Filled 2011-11-10 (×2): qty 1

## 2011-11-10 MED ORDER — LISINOPRIL 10 MG PO TABS
10.0000 mg | ORAL_TABLET | Freq: Every day | ORAL | Status: DC
  Administered 2011-11-10 – 2011-11-11 (×2): 10 mg via ORAL
  Filled 2011-11-10 (×3): qty 1

## 2011-11-10 MED ORDER — FUROSEMIDE 10 MG/ML IJ SOLN
20.0000 mg | Freq: Once | INTRAMUSCULAR | Status: AC
  Administered 2011-11-10: 20 mg via INTRAVENOUS
  Filled 2011-11-10 (×2): qty 2

## 2011-11-10 MED ORDER — LABETALOL HCL 200 MG PO TABS
400.0000 mg | ORAL_TABLET | Freq: Two times a day (BID) | ORAL | Status: DC
  Administered 2011-11-10 (×2): 400 mg via ORAL
  Filled 2011-11-10 (×3): qty 2

## 2011-11-10 NOTE — Progress Notes (Signed)
UR chart review completed.  

## 2011-11-10 NOTE — Progress Notes (Signed)
Update provided to Dr. Kimberlee Nearing received to d/c Magnesium gtt.

## 2011-11-10 NOTE — Progress Notes (Signed)
Subjective: Postpartum Day 2: Cesarean Delivery Patient reports no complaints at all.  No headaches minimal pain no bleeding problems  Objective: Vital signs in last 24 hours: Temp:  [97.7 F (36.5 C)-98.4 F (36.9 C)] 98.4 F (36.9 C) (06/10 0400) Pulse Rate:  [84-96] 93  (06/10 0600) Resp:  [16-22] 18  (06/10 0600) BP: (136-165)/(83-115) 136/102 mmHg (06/10 0600) SpO2:  [97 %-100 %] 100 % (06/10 0600)  Physical Exam:  General: alert, cooperative and no distress Lochia: appropriate Uterine Fundus: firm Incision: healing well DVT Evaluation: No evidence of DVT seen on physical exam. 3-4+DTRs with 3 beats of clonus  Basename 11/10/11 0515 11/09/11 1816  HGB 8.0* 9.1*  HCT 25.6* 29.2*   Recent Results (from the past 24 hour(s))  MAGNESIUM   Collection Time   11/09/11  6:05 PM      Component Value Range   Magnesium 5.5 (*) 1.5 - 2.5 (mg/dL)  CBC   Collection Time   11/09/11  6:16 PM      Component Value Range   WBC 13.8 (*) 4.0 - 10.5 (K/uL)   RBC 4.09  3.87 - 5.11 (MIL/uL)   Hemoglobin 9.1 (*) 12.0 - 15.0 (g/dL)   HCT 40.9 (*) 81.1 - 46.0 (%)   MCV 71.4 (*) 78.0 - 100.0 (fL)   MCH 22.2 (*) 26.0 - 34.0 (pg)   MCHC 31.2  30.0 - 36.0 (g/dL)   RDW 91.4 (*) 78.2 - 15.5 (%)   Platelets 54 (*) 150 - 400 (K/uL)  COMPREHENSIVE METABOLIC PANEL   Collection Time   11/09/11  6:16 PM      Component Value Range   Sodium 136  135 - 145 (mEq/L)   Potassium 3.5  3.5 - 5.1 (mEq/L)   Chloride 101  96 - 112 (mEq/L)   CO2 24  19 - 32 (mEq/L)   Glucose, Bld 112 (*) 70 - 99 (mg/dL)   BUN 6  6 - 23 (mg/dL)   Creatinine, Ser 9.56  0.50 - 1.10 (mg/dL)   Calcium 7.7 (*) 8.4 - 10.5 (mg/dL)   Total Protein 6.2  6.0 - 8.3 (g/dL)   Albumin 2.4 (*) 3.5 - 5.2 (g/dL)   AST 82 (*) 0 - 37 (U/L)   ALT 58 (*) 0 - 35 (U/L)   Alkaline Phosphatase 87  39 - 117 (U/L)   Total Bilirubin 0.6  0.3 - 1.2 (mg/dL)   GFR calc non Af Amer >90  >90 (mL/min)   GFR calc Af Amer >90  >90 (mL/min)  HEPATITIS B  SURFACE ANTIGEN   Collection Time   11/09/11  6:16 PM      Component Value Range   Hepatitis B Surface Ag NEGATIVE  NEGATIVE   RUBELLA SCREEN   Collection Time   11/09/11  6:16 PM      Component Value Range   Rubella 41.7 (*)   RPR   Collection Time   11/09/11  6:16 PM      Component Value Range   RPR NON REACTIVE  NON REACTIVE   RAPID HIV SCREEN ([facility])   Collection Time   11/09/11  6:16 PM      Component Value Range   SUDS Rapid HIV Screen NON REACTIVE  NON REACTIVE   CBC   Collection Time   11/10/11  5:15 AM      Component Value Range   WBC 11.5 (*) 4.0 - 10.5 (K/uL)   RBC 3.60 (*) 3.87 - 5.11 (MIL/uL)   Hemoglobin  8.0 (*) 12.0 - 15.0 (g/dL)   HCT 16.1 (*) 09.6 - 46.0 (%)   MCV 71.1 (*) 78.0 - 100.0 (fL)   MCH 22.2 (*) 26.0 - 34.0 (pg)   MCHC 31.3  30.0 - 36.0 (g/dL)   RDW 04.5 (*) 40.9 - 15.5 (%)   Platelets 48 (*) 150 - 400 (K/uL)      Assessment/Plan: Status post Cesarean section. Postoperative course complicated by thrombocytopenia  Continue current care.  Because of her eclampsia, continued brisk reflexes and clonus and thrombocytopenia I will continue MGSO4, repeat labs this afternoon.  Jonquil Stubbe H 11/10/2011, 7:00 AM

## 2011-11-11 LAB — CBC
HCT: 23.8 % — ABNORMAL LOW (ref 36.0–46.0)
MCHC: 31.1 g/dL (ref 30.0–36.0)
MCV: 72.1 fL — ABNORMAL LOW (ref 78.0–100.0)
Platelets: 59 10*3/uL — ABNORMAL LOW (ref 150–400)
RDW: 18.5 % — ABNORMAL HIGH (ref 11.5–15.5)

## 2011-11-11 MED ORDER — IBUPROFEN 600 MG PO TABS: 600.0000 mg | ORAL_TABLET | Freq: Four times a day (QID) | ORAL | Status: AC

## 2011-11-11 MED ORDER — OXYCODONE-ACETAMINOPHEN 5-325 MG PO TABS: 1.0000 | ORAL_TABLET | ORAL | Status: AC | PRN

## 2011-11-11 MED ORDER — LABETALOL HCL 200 MG PO TABS: 400.0000 mg | ORAL_TABLET | Freq: Three times a day (TID) | ORAL | Status: DC

## 2011-11-11 MED ORDER — LABETALOL HCL 200 MG PO TABS
400.0000 mg | ORAL_TABLET | Freq: Three times a day (TID) | ORAL | Status: DC
  Administered 2011-11-11 (×2): 400 mg via ORAL
  Filled 2011-11-11 (×5): qty 2

## 2011-11-11 MED ORDER — TRIAMTERENE-HCTZ 37.5-25 MG PO TABS: 2.0000 | ORAL_TABLET | Freq: Every day | ORAL | Status: DC

## 2011-11-11 MED ORDER — OXYCODONE-ACETAMINOPHEN 5-325 MG PO TABS: 1.0000 | ORAL_TABLET | ORAL | Status: DC | PRN

## 2011-11-11 MED ORDER — SENNOSIDES-DOCUSATE SODIUM 8.6-50 MG PO TABS: 2.0000 | ORAL_TABLET | Freq: Every day | ORAL | Status: DC

## 2011-11-11 MED ORDER — LISINOPRIL 10 MG PO TABS: 10.0000 mg | ORAL_TABLET | Freq: Every day | ORAL | Status: DC

## 2011-11-11 NOTE — Discharge Instructions (Signed)
Cesarean Delivery Care After Refer to this sheet in the next few weeks. These instructions provide you with information on caring for yourself after your procedure. Your caregiver may also give you more specific instructions. Your treatment has been planned according to current medical practices, but problems sometimes occur. Call your caregiver if you have any problems or questions after your procedure. HOME CARE INSTRUCTIONS Healing will take time. You will have discomfort, tenderness, swelling, and bruising at the surgery site for a couple of weeks. This is normal and will get better as time goes on. Activity  Rest as much as possible the first 2 weeks.   When possible, have someone help you with your household activities and your baby for 2 to 3 weeks.   Limit your housework and social activity. Increase your activity gradually as your strength returns.   Do not climb stairs more than 2 to 3 times a day.   Do not lift anything heavier than your baby.   Follow your caregiver's instructions about driving a car.   Exercise only as directed by your caregiver.  Nutrition  You may return to your usual diet. Eat a well-balanced diet.   Drink enough fluid to keep your urine clear or pale yellow.   Keep taking your prenatal or multivitamins.   Do not drink alcohol until your caregiver says it is okay.  Elimination You should return to your usual bowel function. If you develop constipation, ask your caregiver about taking a mild laxative that will help you go to the bathroom. Bran foods and fluids help with constipation. Gradually add fruit, vegetables, and bran to your diet.  Hygiene  You may shower, wash your hair, and take tub baths unless your caregiver tells you otherwise.   Continue perineal care until your vaginal bleeding and discharge stops.   Do not douche or use tampons until your caregiver says it is okay.  Fever If you feel feverish or have shaking chills, take your  temperature. The fever may indicate infection. Infections can be treated with antibiotic medicine. Pain Control and Medicine  Only take over-the-counter or prescription medicine as directed by your caregiver. Do not take aspirin. It can cause bleeding.   Do not drive when taking pain medicine.   Talk to your caregiver about restarting or adjusting your normal medicines.  Incision Care  Clean your cut (incision) gently with soap and water, then pat dry.   If your caregiver says it is okay, leave the incision without a bandage (dressing) unless it is draining fluid or irritated.   If you have small adhesive strips across the incision and they do not fall off within 7 days, carefully peel them off.   Check the incision daily for increased redness, drainage, swelling, or separation of skin.   Hug a pillow when coughing or sneezing. This helps to relieve pain.  Vaginal Care You may have a vaginal discharge or bleeding for up to 6 weeks. If the vaginal discharge becomes bright red, bad smelling, heavy in amount, has blood clots, or if you have burning or frequent urination, call your caregiver.If your bleeding slows down and then gets heavier, your body is telling you to slow down and relax more. Sexual Intercourse  Check with your caregiver before resuming sexual activity. Often, after 4 to 6 weeks, if you feel good and are well rested, sexual activity may be resumed. Avoid positions that strain the incision site.   You can become pregnant before you have a period.   If you decide to have sexual intercourse, use birth control if you do not want to become pregnant right away.  Health Practices  Keep all your postpartum appointments as recommended by your caregiver. Generally, your caregiver will want to see you in 2 to 3 weeks.   Continue with your yearly pelvic exams.   Continue monthly self-breast exams and yearly physical exams with a Pap test.  Breast Care  If you are not  breastfeeding and your breasts become tender, hard, or leak milk, you may wear a tight-fitting bra and apply ice to your breasts.   If you are breastfeeding, wear a good support bra.   Call your caregiver if you have breast pain, flu-like symptoms, fever, or hardness and reddening of your breasts.  Postpartum Blues You may have a period of low spirits or "blues" after your baby is born. Discuss your feelings with your partner, family, and friends. This may be caused by the changing hormone levels in your body. You may want to contact your caregiver if this is worrisome. Miscellaneous  Limit wearing support panties or control-top hose.   If you breastfeed, you may not have a period for several months or longer. This is normal for the nursing mother. If you do not menstruate within 6 weeks after you stop breastfeeding, see your caregiver.   If you are not breastfeeding, you can expect to menstruate within 6 to 10 weeks after birth. If you have not started by the 11th week, check with your caregiver.  SEEK MEDICAL CARE IF:   There is swelling, redness, or increasing pain in the wound area.   You have pus coming from the wound.   You notice a bad smell from the wound or surgical dressing.   You have pain, redness, and swelling from the intravenous (IV) site.   Your wound breaks open (the edges are not staying together).   You feel dizzy or feel like fainting.   You develop pain or bleeding when you urinate.   You develop diarrhea.   You develop nausea and vomiting.   You develop abnormal vaginal discharge.   You develop a rash.   You have any type of abnormal reaction or develop an allergy to your medicine.   Your pain is not relieved by your medicine or becomes worse.   Your temperature is 101 F (38.3 C), or is 100.4 F (38 C) taken 2 times in a 4 hour period.  SEEK IMMEDIATE MEDICAL CARE:  You develop a temperature of 102 F (38.9 C) or higher.   You develop abdominal  pain.   You develop chest pain.   You develop shortness of breath.   You faint.   You develop pain, swelling, or redness of your leg.   You develop heavy vaginal bleeding with or without blood clots.  Document Released: 02/08/2002 Document Revised: 05/08/2011 Document Reviewed: 08/14/2010 [facility] Patient Information 2012 [place], [place].  Hypertension During Pregnancy Hypertension is also called high blood pressure. Blood pressure moves blood in your body. Sometimes, the force that moves the blood becomes too strong. When you are pregnant, this condition should be watched carefully. It can cause problems for you and your baby. HOME CARE   Make and keep all of your doctor visits.   Take medicine as told by your doctor. Tell your doctor about all medicines you take.   Eat very little salt.   Exercise regularly.   Do not drink alcohol.   Do not smoke.  Do not have drinks with caffeine.   Lie on your left side when resting.  GET HELP RIGHT AWAY IF:  You have bad belly (abdominal) pain.   You have sudden puffiness (swelling) in the hands, ankles, or face.   You gain 4 pounds (1.8 kilograms) or more in 1 week.   You throw up (vomit) repeatedly.   You have bleeding from the vagina.   You do not feel the baby moving as much.   You have a headache.   You have blurred or double vision.   You have muscle twitching or spasms.   You have shortness of breath.   You have blue fingernails and lips.   You have blood in your pee (urine).  MAKE SURE YOU:  Understand these instructions.   Will watch your condition.   Will get help right away if you are not doing well.  Document Released: 06/21/2010 Document Revised: 05/08/2011 Document Reviewed: 01/03/2011 [facility] Patient Information 2012 [place], [place].

## 2011-11-11 NOTE — Discharge Summary (Addendum)
Obstetric Discharge Summary Reason for Admission: Eclamptic Seizure Prenatal Procedures: none Intrapartum Procedures: cesarean: low cervical, transverse Postpartum Procedures: magnesium Complications-Operative and Postpartum: none  Hospital Course: Patient seen in MAU with no prenatal care after an eclamptic seizure.  She had a head CT done to rule out hemorrhage as the patient struck her head during the seizure.  The CT showed PRES.  Patient taken for urgent cesarean section.   She was then admitted to the AICU for magnesium sulfate administration.  Her postpartum course was complicated by difficult to control blood pressures and was started on 4 blood pressure medications.  She is currently stable for discharge.  She will have nursing visit in 1 week for BP check.  Hemoglobin  Date Value Range Status  11/11/2011 7.4* 12.0-15.0 (g/dL) Final     HCT  Date Value Range Status  11/11/2011 23.8* 36.0-46.0 (%) Final    Physical Exam:  General: alert, cooperative and no distress Lochia: appropriate Uterine Fundus: firm Incision: healing well, no significant drainage, no dehiscence, no significant erythema DVT Evaluation: No evidence of DVT seen on physical exam.  Discharge Diagnoses: Eclampsia, Preterm delivery, s/p cesarean section  Discharge Information: Date: 11/11/2011 Activity: pelvic rest Diet: routine Medications: PNV, Ibuprofen and Percocet Condition: stable Instructions: refer to practice specific booklet Discharge to: home Follow-up Information    Follow up with [facility] on 12/11/2011.   Contact information:   [address] [place] [address]          Newborn Data: Live born female  Birth Weight: 2 lb 13.9 oz (1300 g) APGAR: 5, 8  Home with mother.  [name] [name] JEHIEL 11/11/2011, 2:46 PM

## 2011-11-11 NOTE — Progress Notes (Signed)
Subjective: Postpartum Day 3: Cesarean Delivery Patient reports tolerating PO, + BM and no problems voiding.    Objective: Vital signs in last 24 hours: Temp:  [97.8 F (36.6 C)-98.5 F (36.9 C)] 98.1 F (36.7 C) (06/11 0025) Pulse Rate:  [73-93] 73  (06/11 0522) Resp:  [16-20] 18  (06/11 0522) BP: (121-152)/(68-100) 149/96 mmHg (06/11 0522) SpO2:  [99 %-100 %] 100 % (06/11 0522) Weight:  [79.606 kg (175 lb 8 oz)] 79.606 kg (175 lb 8 oz) (06/11 0522)  Physical Exam:  General: alert, cooperative and no distress Lochia: appropriate Uterine Fundus: firm Incision: healing well, no significant drainage, no significant erythema DVT Evaluation: No evidence of DVT seen on physical exam. 2+ DTR, + clonus   Basename 11/11/11 0505 11/10/11 1534  HGB 7.4* 8.2*  HCT 23.8* 26.4*    Assessment/Plan: Status post Cesarean section. Doing well postoperatively.  Continue monitoring BP. Discharge planning later today  Joelle Roswell 11/11/2011, 7:16 AM

## 2011-11-11 NOTE — Progress Notes (Signed)
Updated Dr. Shawnie Pons on pt's vitals/assessment. Pt. May be D/C'd home, awaiting orders. Pt. Informed of D/C home and instructed to alert person picking her up to come on in to get her. Pt. verbalizes understanding of discharge and agreeable

## 2011-11-12 ENCOUNTER — Encounter: Payer: Self-pay | Admitting: Obstetrics & Gynecology

## 2011-11-12 ENCOUNTER — Telehealth: Payer: Self-pay | Admitting: Obstetrics & Gynecology

## 2011-11-12 NOTE — Telephone Encounter (Signed)
Called pt to inform her of the BP appointment. Both numbers have been disconnected. Will be sending her a letter about appointment on 11/19/2011 @ 10:45

## 2011-11-18 ENCOUNTER — Encounter: Payer: Self-pay | Admitting: Family Medicine

## 2011-11-19 ENCOUNTER — Encounter: Payer: Self-pay | Admitting: *Deleted

## 2011-11-30 ENCOUNTER — Emergency Department (HOSPITAL_COMMUNITY)
Admission: EM | Admit: 2011-11-30 | Discharge: 2011-12-01 | Discharge status: Against Medical Advice | Payer: Self-pay | Attending: Emergency Medicine | Admitting: Emergency Medicine

## 2011-11-30 ENCOUNTER — Encounter (HOSPITAL_COMMUNITY): Payer: Self-pay | Admitting: Emergency Medicine

## 2011-11-30 DIAGNOSIS — R51 Headache: Secondary | ICD-10-CM | POA: Insufficient documentation

## 2011-11-30 HISTORY — DX: Unspecified convulsions: R56.9

## 2011-11-30 MED ORDER — ACETAMINOPHEN 325 MG PO TABS: 650.0000 mg | ORAL_TABLET | Freq: Once | ORAL | Status: DC

## 2011-11-30 MED ORDER — SODIUM CHLORIDE 0.9 % IV BOLUS (SEPSIS): 1000.0000 mL | Freq: Once | INTRAVENOUS | Status: DC

## 2011-11-30 NOTE — ED Notes (Addendum)
C/o headache since 5pm.  Denies nausea and vomiting.  No other symptoms. Pt had c- section on June 8th.

## 2011-12-03 ENCOUNTER — Inpatient Hospital Stay (HOSPITAL_COMMUNITY)
Admission: AD | Admit: 2011-12-03 | Discharge: 2011-12-03 | Disposition: A | Discharge status: Home / Self Care | Payer: Self-pay | Source: Ambulatory Visit | Attending: Obstetrics and Gynecology | Admitting: Obstetrics and Gynecology

## 2011-12-03 ENCOUNTER — Encounter (HOSPITAL_COMMUNITY): Payer: Self-pay | Admitting: *Deleted

## 2011-12-03 DIAGNOSIS — I10 Essential (primary) hypertension: Secondary | ICD-10-CM

## 2011-12-03 DIAGNOSIS — O135 Gestational [pregnancy-induced] hypertension without significant proteinuria, complicating the puerperium: Secondary | ICD-10-CM | POA: Insufficient documentation

## 2011-12-03 LAB — URINALYSIS, ROUTINE W REFLEX MICROSCOPIC
Bilirubin Urine: NEGATIVE
Glucose, UA: NEGATIVE mg/dL
Hgb urine dipstick: NEGATIVE
Ketones, ur: 15 mg/dL — AB
Protein, ur: NEGATIVE mg/dL

## 2011-12-03 LAB — COMPREHENSIVE METABOLIC PANEL
ALT: 10 U/L (ref 0–35)
BUN: 4 mg/dL — ABNORMAL LOW (ref 6–23)
CO2: 28 mEq/L (ref 19–32)
Calcium: 9.4 mg/dL (ref 8.4–10.5)
Creatinine, Ser: 0.73 mg/dL (ref 0.50–1.10)
GFR calc Af Amer: >90 mL/min (ref >90)
GFR calc non Af Amer: >90 mL/min (ref >90)
Glucose, Bld: 98 mg/dL (ref 70–99)

## 2011-12-03 LAB — CBC
MCH: 21.5 pg — ABNORMAL LOW (ref 26.0–34.0)
MCHC: 29.7 g/dL — ABNORMAL LOW (ref 30.0–36.0)
Platelets: 218 10*3/uL (ref 150–400)

## 2011-12-03 LAB — PROTEIN / CREATININE RATIO, URINE: Creatinine, Urine: 443.72 mg/dL

## 2011-12-03 MED ORDER — LISINOPRIL 10 MG PO TABS: 20.0000 mg | ORAL_TABLET | Freq: Every day | ORAL | Status: DC

## 2011-12-03 MED ORDER — AMLODIPINE BESYLATE 10 MG PO TABS: 10.0000 mg | ORAL_TABLET | Freq: Every day | ORAL | Status: DC

## 2011-12-03 NOTE — MAU Note (Signed)
Delivered by c/s- eclamptic on 06/08, was preterm.   Baby is doing good, is still in the NICU, may be dc'd tomorrow. Bleeding stopped about a wk ago.

## 2011-12-03 NOTE — MAU Note (Signed)
No real complaints, wants a note to return to work.

## 2011-12-03 NOTE — MAU Provider Note (Signed)
History     CSN: [id number]  Arrival date and time: 12/03/11 1625   First Provider Initiated Contact with Patient 12/03/11 1855      Chief Complaint  Patient presents with  . Hypertension   HPI  Pt here with needing a release to go back to work.  Stat c-section on 11/08/11 due to eclamptic seizure.  Hospitalized x 4 days.  Discharged home on blood pressure medications.  Taking the meds that are BID, having difficulty remembering to take the TID medication.  Reports no vaginal bleeding or uterine cramping.  Denies headache, vision changes, or epigastric pain.  Infant still in NICU, possible discharge this week.    Past Medical History  Diagnosis Date  . Pregnancy induced hypertension   . Seizures     Past Surgical History  Procedure Date  . Cesarean section 11/08/2011    Procedure: CESAREAN SECTION;  Surgeon: Tereso Newcomer, MD;  Location: [facility];  Service: Gynecology;  Laterality: N/A;  Primary cesarean section with delivery of baby boy at 1620.    Family History  Problem Relation Age of Onset  . Anesthesia problems Neg Hx   . Other Neg Hx     History  Substance Use Topics  . Smoking status: Never Smoker   . Smokeless tobacco: Never Used  . Alcohol Use: No    Allergies: No Known Allergies  Prescriptions prior to admission  Medication Sig Dispense Refill  . labetalol (NORMODYNE) 200 MG tablet Take 2 tablets (400 mg total) by mouth every 8 (eight) hours.  180 tablet  3  . lisinopril (PRINIVIL,ZESTRIL) 10 MG tablet Take 1 tablet (10 mg total) by mouth daily.  30 tablet  3  . Prenatal Vit-Fe Fumarate-FA (PRENATAL MULTIVITAMIN) TABS Take 1 tablet by mouth at bedtime.      . triamterene-hydrochlorothiazide (MAXZIDE-25) 37.5-25 MG per tablet Take 2 each (2 tablets total) by mouth daily.  60 tablet  3    ROS See HPI  Physical Exam   Blood pressure 130/107, pulse 77, temperature 98.6 F (37 C), temperature source Oral, resp. rate 20, height 5\' 3"  (1.6 m), weight 69.491  kg (153 lb 3.2 oz), SpO2 100.00%, unknown if currently breastfeeding.  Physical Exam  Constitutional: She is oriented to person, place, and time. She appears well-developed and well-nourished. No distress.  HENT:  Head: Normocephalic.  Eyes: Pupils are equal, round, and reactive to light.  Neck: Normal range of motion. Neck supple.  Cardiovascular: Normal rate and regular rhythm.   Respiratory: Effort normal and breath sounds normal.  GI: Soft. There is no tenderness.  Genitourinary: No bleeding around the vagina.  Musculoskeletal: Normal range of motion. She exhibits no edema.  Neurological: She is alert and oriented to person, place, and time. She has normal reflexes. She displays normal reflexes.  Skin: Skin is warm and dry.    MAU Course  Procedures  Results for orders placed during the hospital encounter of 12/03/11 (from the past 24 hour(s))  URINALYSIS, ROUTINE W REFLEX MICROSCOPIC     Status: Abnormal   Collection Time   12/03/11  7:21 PM      Component Value Range   Color, Urine YELLOW  YELLOW   APPearance CLEAR  CLEAR   Specific Gravity, Urine 1.020  1.005 - 1.030   pH 6.5  5.0 - 8.0   Glucose, UA NEGATIVE  NEGATIVE mg/dL   Hgb urine dipstick NEGATIVE  NEGATIVE   Bilirubin Urine NEGATIVE  NEGATIVE   Ketones, ur  15 (*) NEGATIVE mg/dL   Protein, ur NEGATIVE  NEGATIVE mg/dL   Urobilinogen, UA 4.0 (*) 0.0 - 1.0 mg/dL   Nitrite NEGATIVE  NEGATIVE   Leukocytes, UA NEGATIVE  NEGATIVE  CBC     Status: Abnormal   Collection Time   12/03/11  7:27 PM      Component Value Range   WBC 4.1  4.0 - 10.5 K/uL   RBC 4.28  3.87 - 5.11 MIL/uL   Hemoglobin 9.2 (*) 12.0 - 15.0 g/dL   HCT 40.9 (*) 81.1 - 91.4 %   MCV 72.4 (*) 78.0 - 100.0 fL   MCH 21.5 (*) 26.0 - 34.0 pg   MCHC 29.7 (*) 30.0 - 36.0 g/dL   RDW 78.2 (*) 95.6 - 21.3 %   Platelets 218  150 - 400 K/uL  COMPREHENSIVE METABOLIC PANEL     Status: Abnormal   Collection Time   12/03/11  7:27 PM      Component Value Range    Sodium 142  135 - 145 mEq/L   Potassium 3.2 (*) 3.5 - 5.1 mEq/L   Chloride 105  96 - 112 mEq/L   CO2 28  19 - 32 mEq/L   Glucose, Bld 98  70 - 99 mg/dL   BUN 4 (*) 6 - 23 mg/dL   Creatinine, Ser 0.86  0.50 - 1.10 mg/dL   Calcium 9.4  8.4 - 57.8 mg/dL   Total Protein 7.3  6.0 - 8.3 g/dL   Albumin 3.6  3.5 - 5.2 g/dL   AST 18  0 - 37 U/L   ALT 10  0 - 35 U/L   Alkaline Phosphatase 64  39 - 117 U/L   Total Bilirubin 0.2 (*) 0.3 - 1.2 mg/dL   GFR calc non Af Amer >90  >90 mL/min   GFR calc Af Amer >90  >90 mL/min   Consulted with Dr. Precious Reel HPI/OB hx/exam > ok to discharge home with adjustment to medication.    Assessment and Plan  Chronic Hypertension  Plan: DC to home DC Labetolol  Add Norvasc 10 mg QD Increase Zestril 20 mg Continue Maxide Return for BP check in one week.   [facility] 12/03/2011, 7:00 PM

## 2011-12-03 NOTE — MAU Note (Signed)
Patient states she had a primary cesarean section on 6-8. Needs to have a release to return to work. Has not had her post partum visit yet. Is not having any problems.

## 2011-12-07 NOTE — MAU Provider Note (Signed)
Attestation of Attending Supervision of Advanced Practitioner: Evaluation and management procedures were performed by the PA/NP/CNM/OB Fellow under my supervision/collaboration. Chart reviewed and agree with management and plan.We discussed the Antihypertensive med changes chosen and I agree with choices.  Shaylinn Hladik V 12/07/2011 11:15 AM

## 2011-12-11 ENCOUNTER — Ambulatory Visit: Payer: Self-pay | Admitting: Advanced Practice Midwife

## 2012-06-02 NOTE — L&D Delivery Note (Signed)
I have seen and examined this patient and I agree with the above. For BTL @ 0945 11/13. Cam Hai 8:50 AM 04/14/2013

## 2012-06-02 NOTE — L&D Delivery Note (Signed)
Delivery Note At 10:49 PM a viable female was delivered via Vaginal, Spontaneous Delivery (Presentation: ROA  ).  APGAR: 8,9 ; weight 6lb13oz Placenta status: intact,3 vessel .  Cord:  with the following complications: none.  Cord pH: not sent  Anesthesia: Epidural  Episiotomy: n/a Lacerations: bilateral labial Suture Repair: n/a Est. Blood Loss (mL): 250  Mom to postpartum.  Baby to Couplet care / Skin to Skin.  Pt progressed rapidly to c/c/+2. A viable baby boy was delivered with good maternal effort for 30 mins over an intact perineum via NSVD. Baby with instantaneous cry but requiring some suctioning. Cord clamped and cut and baby taken to warmer, not requiring resusc efforts. Third stage of labor managed with manual traction. Bilateral labial lacerations noted but hemostatic not requiring stitch. EBL approx 250. Baby and mother doing well and hemodynamically stable for transfer to postpartum.  Mother bottle feeding. Desires ppBTL for contraception.  [name] 04/13/2013, 11:09 PM

## 2013-02-01 ENCOUNTER — Inpatient Hospital Stay (HOSPITAL_COMMUNITY)
Admission: AD | Admit: 2013-02-01 | Discharge: 2013-02-01 | Disposition: A | Discharge status: Home / Self Care | Payer: Medicaid Other | Source: Ambulatory Visit | Attending: Obstetrics & Gynecology | Admitting: Obstetrics & Gynecology

## 2013-02-01 ENCOUNTER — Encounter (HOSPITAL_COMMUNITY): Payer: Self-pay

## 2013-02-01 DIAGNOSIS — O99019 Anemia complicating pregnancy, unspecified trimester: Secondary | ICD-10-CM | POA: Insufficient documentation

## 2013-02-01 DIAGNOSIS — O0992 Supervision of high risk pregnancy, unspecified, second trimester: Secondary | ICD-10-CM

## 2013-02-01 DIAGNOSIS — O0930 Supervision of pregnancy with insufficient antenatal care, unspecified trimester: Secondary | ICD-10-CM | POA: Insufficient documentation

## 2013-02-01 DIAGNOSIS — O0933 Supervision of pregnancy with insufficient antenatal care, third trimester: Secondary | ICD-10-CM

## 2013-02-01 DIAGNOSIS — D649 Anemia, unspecified: Secondary | ICD-10-CM

## 2013-02-01 HISTORY — DX: Asymptomatic varicose veins of unspecified lower extremity: I83.90

## 2013-02-01 LAB — URINALYSIS, ROUTINE W REFLEX MICROSCOPIC
Bilirubin Urine: NEGATIVE
Nitrite: NEGATIVE
Specific Gravity, Urine: 1.025 (ref 1.005–1.030)
Urobilinogen, UA: 1 mg/dL (ref 0.0–1.0)

## 2013-02-01 LAB — DIFFERENTIAL
Basophils Relative: 0 % (ref 0–1)
Eosinophils Absolute: 0 10*3/uL (ref 0.0–0.7)
Eosinophils Relative: 0 % (ref 0–5)
Neutrophils Relative %: 72 % (ref 43–77)

## 2013-02-01 LAB — CBC
MCH: 21.2 pg — ABNORMAL LOW (ref 26.0–34.0)
MCHC: 31.2 g/dL (ref 30.0–36.0)
MCV: 67.9 fL — ABNORMAL LOW (ref 78.0–100.0)
Platelets: 137 10*3/uL — ABNORMAL LOW (ref 150–400)

## 2013-02-01 LAB — TYPE AND SCREEN: Antibody Screen: NEGATIVE

## 2013-02-01 LAB — HEPATITIS B SURFACE ANTIGEN: Hepatitis B Surface Ag: NEGATIVE

## 2013-02-01 LAB — ABO/RH: ABO/RH(D): A POS

## 2013-02-01 LAB — URINE MICROSCOPIC-ADD ON

## 2013-02-01 LAB — HIV ANTIBODY (ROUTINE TESTING W REFLEX): HIV: NONREACTIVE

## 2013-02-01 MED ORDER — INTEGRA F 125-1 MG PO CAPS: 1.0000 | ORAL_CAPSULE | Freq: Every day | ORAL | Status: AC

## 2013-02-01 NOTE — MAU Note (Signed)
Patient is in to get started with prenatal care. She states that her lmp 07/24/12 and have not started care because she is scared and does not want to be told that her bp is elevated. Patient states that she had preeclampsia and seizure that resulted in an emergency c-section a year ago. She denies any pain, discomfort, bleeding today. fht 138-139. Abdomen is soft and non-tender.

## 2013-02-01 NOTE — MAU Provider Note (Signed)
History     CSN: [id number]  Arrival date and time: 02/01/13 0906   None     Chief Complaint  Patient presents with  . Illegal value: [   HPI  Pt is [id number] here at [redacted]w[redacted]d with report of seeking prenatal care. Patient had eclampsia with last pregnancy and had an emergency c-section. Patient denies lof, vaginal bleeding. Reports good fetal movement. Patient c/o some pain on her right knee/leg; + varicose veins. No calf pain or swelling.     Past Medical History  Diagnosis Date  . Pregnancy induced hypertension   . Seizures   . Varicose veins     Past Surgical History  Procedure Laterality Date  . Cesarean section  11/08/2011    Procedure: CESAREAN SECTION;  Surgeon: Tereso Newcomer, MD;  Location: [facility];  Service: Gynecology;  Laterality: N/A;  Primary cesarean section with delivery of baby boy at 1620.    Family History  Problem Relation Age of Onset  . Anesthesia problems Neg Hx   . Other Neg Hx     History  Substance Use Topics  . Smoking status: Never Smoker   . Smokeless tobacco: Never Used  . Alcohol Use: No    Allergies: No Known Allergies  No prescriptions prior to admission    Review of Systems  Musculoskeletal:       Right leg pain  X one month  All other systems reviewed and are negative.   Physical Exam   Blood pressure 134/73, pulse 97, temperature 98.1 F (36.7 C), temperature source Oral, resp. rate 16, height 5\' 3"  (1.6 m), weight 76.885 kg (169 lb 8 oz), last menstrual period 07/24/2012, SpO2 95.00%.  Physical Exam  Constitutional: She is oriented to person, place, and time. She appears well-developed and well-nourished. No distress.  HENT:  Head: Normocephalic.  Neck: Normal range of motion. Neck supple.  Cardiovascular: Normal rate, regular rhythm and normal heart sounds.   Respiratory: Effort normal and breath sounds normal.  GI: Soft. There is no tenderness.  Fundal height 25  Genitourinary: No bleeding around the vagina. Vaginal  discharge (mucusy) found.  Musculoskeletal: Normal range of motion. She exhibits no edema.  Neurological: She is alert and oriented to person, place, and time.  Skin: Skin is warm and dry.   FHR 130's, +accels Toco - none  MAU Course  Procedures  Results for orders placed during the hospital encounter of 02/01/13 (from the past 24 hour(s))  URINALYSIS, ROUTINE W REFLEX MICROSCOPIC     Status: Abnormal   Collection Time    02/01/13  9:18 AM      Result Value Range   Color, Urine YELLOW  YELLOW   APPearance CLEAR  CLEAR   Specific Gravity, Urine 1.025  1.005 - 1.030   pH 7.0  5.0 - 8.0   Glucose, UA NEGATIVE  NEGATIVE mg/dL   Hgb urine dipstick NEGATIVE  NEGATIVE   Bilirubin Urine NEGATIVE  NEGATIVE   Ketones, ur NEGATIVE  NEGATIVE mg/dL   Protein, ur NEGATIVE  NEGATIVE mg/dL   Urobilinogen, UA 1.0  0.0 - 1.0 mg/dL   Nitrite NEGATIVE  NEGATIVE   Leukocytes, UA MODERATE (*) NEGATIVE  URINE MICROSCOPIC-ADD ON     Status: Abnormal   Collection Time    02/01/13  9:18 AM      Result Value Range   Squamous Epithelial / LPF FEW (*) RARE   WBC, UA 7-10  <3 WBC/hpf   Bacteria, UA FEW (*) RARE  FHR 130's, +accels, reactive Toco - none  Assessment and Plan  No Prenatal Care [id number] at [redacted]w[redacted]d  Anemia  Plan: Discharge to home Obtain prenatal labs RX Integra Schedule outpatient ultrasound Schedule appointment in [facility] clinic.  [facility] 02/01/2013, 10:43 AM

## 2013-02-01 NOTE — MAU Note (Signed)
Pt came in to start seeking prenatal care. Patient had eclampsia with last pregnancy and had an emergency c-section. Patient denies lof, vaginal bleeding. Reports good fetal movement. Patient c/o some pain on her right leg varicose veins.

## 2013-02-01 NOTE — MAU Provider Note (Signed)
Attestation of Attending Supervision of Advanced Practitioner (CNM/NP): Evaluation and management procedures were performed by the Advanced Practitioner under my supervision and collaboration.  I have reviewed the Advanced Practitioner's note and chart, and I agree with the management and plan.  HARRAWAY-SMITH, Kortny Lirette 12:14 PM     

## 2013-02-02 ENCOUNTER — Ambulatory Visit (HOSPITAL_COMMUNITY)
Admit: 2013-02-02 | Discharge: 2013-02-02 | Disposition: A | Discharge status: Home / Self Care | Payer: Medicaid Other | Attending: Family | Admitting: Family

## 2013-02-02 ENCOUNTER — Ambulatory Visit (HOSPITAL_COMMUNITY)
Admission: RE | Admit: 2013-02-02 | Discharge: 2013-02-02 | Disposition: A | Discharge status: Home / Self Care | Payer: Medicaid Other | Source: Ambulatory Visit | Attending: Family | Admitting: Family

## 2013-02-02 ENCOUNTER — Other Ambulatory Visit (HOSPITAL_COMMUNITY): Payer: Self-pay | Admitting: Family

## 2013-02-02 DIAGNOSIS — O0992 Supervision of high risk pregnancy, unspecified, second trimester: Secondary | ICD-10-CM

## 2013-02-02 LAB — RUBELLA SCREEN: Rubella: 1.59 Index — ABNORMAL HIGH (ref <0.90)

## 2013-02-02 NOTE — Progress Notes (Signed)
[name] [name]  was seen today for an ultrasound appointment.  See full report in [facility].  Impression: Single IUP at 31 0/7 weeks by ultrasound today  Late onset of prenatal care, unsure LMP Estimated fetal weight today at the 52nd %tile. Normal anatomic fetal survey; although somewhat limited due to late gestational age and fetal position Normal amniotic fluid volume  Recommendations: Recommend follow-up ultrasound examination in 3-4 weeks due to poor dates / interval growth  Alpha Gula, MD

## 2013-02-03 ENCOUNTER — Telehealth: Payer: Self-pay

## 2013-02-03 ENCOUNTER — Encounter: Payer: Self-pay | Admitting: Family

## 2013-02-03 ENCOUNTER — Other Ambulatory Visit: Payer: Self-pay | Admitting: Obstetrics & Gynecology

## 2013-02-03 DIAGNOSIS — O0993 Supervision of high risk pregnancy, unspecified, third trimester: Secondary | ICD-10-CM | POA: Insufficient documentation

## 2013-02-03 DIAGNOSIS — O2342 Unspecified infection of urinary tract in pregnancy, second trimester: Secondary | ICD-10-CM

## 2013-02-03 LAB — URINE CULTURE

## 2013-02-03 MED ORDER — CEPHALEXIN 250 MG PO CAPS: 250.0000 mg | ORAL_CAPSULE | Freq: Three times a day (TID) | ORAL | Status: DC

## 2013-02-03 NOTE — Telephone Encounter (Signed)
Called pt and left message to return call to the clinics its concerning an appt. And that there is a Rx for antibiotics for UTI at her [facility] off [address].

## 2013-02-03 NOTE — Telephone Encounter (Signed)
Message copied by Faythe Casa on Thu Feb 03, 2013  4:45 PM ------      Message from: Willodean Rosenthal      Created: Thu Feb 03, 2013 10:54 AM       Please call pt.  Please call in keflex 250mg  tid x 5days for UTI.                  Also, please confirm that she has f/u appt to establish OB care.            Thx,      clh-S  ------

## 2013-02-10 ENCOUNTER — Encounter: Payer: Self-pay | Admitting: Gynecology

## 2013-02-10 ENCOUNTER — Encounter: Payer: Self-pay | Admitting: Obstetrics and Gynecology

## 2013-02-11 ENCOUNTER — Encounter (HOSPITAL_COMMUNITY): Payer: Self-pay | Admitting: *Deleted

## 2013-02-11 ENCOUNTER — Inpatient Hospital Stay (HOSPITAL_COMMUNITY)
Admission: AD | Admit: 2013-02-11 | Discharge: 2013-02-11 | Disposition: A | Discharge status: Home / Self Care | Payer: Medicaid Other | Source: Ambulatory Visit | Attending: Obstetrics & Gynecology | Admitting: Obstetrics & Gynecology

## 2013-02-11 DIAGNOSIS — O212 Late vomiting of pregnancy: Secondary | ICD-10-CM | POA: Insufficient documentation

## 2013-02-11 DIAGNOSIS — R197 Diarrhea, unspecified: Secondary | ICD-10-CM

## 2013-02-11 DIAGNOSIS — O0993 Supervision of high risk pregnancy, unspecified, third trimester: Secondary | ICD-10-CM

## 2013-02-11 DIAGNOSIS — O479 False labor, unspecified: Secondary | ICD-10-CM

## 2013-02-11 DIAGNOSIS — R12 Heartburn: Secondary | ICD-10-CM | POA: Insufficient documentation

## 2013-02-11 DIAGNOSIS — R1084 Generalized abdominal pain: Secondary | ICD-10-CM

## 2013-02-11 DIAGNOSIS — R109 Unspecified abdominal pain: Secondary | ICD-10-CM | POA: Insufficient documentation

## 2013-02-11 DIAGNOSIS — O219 Vomiting of pregnancy, unspecified: Secondary | ICD-10-CM

## 2013-02-11 DIAGNOSIS — O99891 Other specified diseases and conditions complicating pregnancy: Secondary | ICD-10-CM | POA: Insufficient documentation

## 2013-02-11 DIAGNOSIS — N949 Unspecified condition associated with female genital organs and menstrual cycle: Secondary | ICD-10-CM | POA: Insufficient documentation

## 2013-02-11 DIAGNOSIS — R112 Nausea with vomiting, unspecified: Secondary | ICD-10-CM

## 2013-02-11 LAB — URINE MICROSCOPIC-ADD ON

## 2013-02-11 LAB — URINALYSIS, ROUTINE W REFLEX MICROSCOPIC
Glucose, UA: NEGATIVE mg/dL
Hgb urine dipstick: NEGATIVE
Protein, ur: NEGATIVE mg/dL
Specific Gravity, Urine: 1.02 (ref 1.005–1.030)
Urobilinogen, UA: 1 mg/dL (ref 0.0–1.0)

## 2013-02-11 NOTE — MAU Note (Signed)
Patient presents to MAU with c/o cramping lower abdominal pain on 4/10 pain scale that started today and clear vaginal discharge. States also having vomiting and diarrhea that has progressively gotten worse over the last 2 days. Denies LOF, bleeding, nor contractions.

## 2013-02-11 NOTE — [facility] Provider Note (Signed)
Chief Complaint:  Abdominal Pain, Emesis, Diarrhea and Vaginal Discharge   First Provider Initiated Contact with Patient 02/11/13 1354      HPI: Negin Hegg is a 27 y.o. [id number] at [redacted]w[redacted]d who presents to maternity admissions reporting a single episode 2 hours ago of epigastric abdominal pain followed by a vomiting episode. Denies fever, exposure to anyone in no, and usual food intake. She occasionally has heartburn. In addition she thought she may have  urinary incontinence, leakage of fluid or diarrhea because she saw some brownish discharge. She no longer has any pain or nausea. She  was also concerned that she missed her prenatal appointment yesterday. Denies contractions, leakage of fluid or vaginal bleeding. Good fetal movement.   Pregnancy Course: PNC at [facility] due to PTB x2 and hx eclampsia, C/S  Past Medical History: Past Medical History  Diagnosis Date  . Pregnancy induced hypertension   . Seizures   . Varicose veins     Past obstetric history: OB History  Gravida Para Term Preterm AB SAB TAB Ectopic Multiple Living  3 2 0 2 0 0 0 0 0 2     # Outcome Date GA Lbr Len/2nd Weight Sex Delivery Anes PTL Lv  3 CUR           2 PRE 11/08/11   2 lb 13.9 oz (1.3 kg) M LTCS Spinal  Y     Comments: Preeclampsia  1 PRE 12/19/09   3 lb 13 oz (1.729 kg) F SVD  Y Y      Past Surgical History: Past Surgical History  Procedure Laterality Date  . Cesarean section  11/08/2011    Procedure: CESAREAN SECTION;  Surgeon: Tereso Newcomer, MD;  Location: [facility];  Service: Gynecology;  Laterality: N/A;  Primary cesarean section with delivery of baby boy at 1620.     Family History: Family History  Problem Relation Age of Onset  . Anesthesia problems Neg Hx   . Other Neg Hx   . Diabetes Maternal Grandmother   . Hypertension Maternal Grandmother     Social History: History  Substance Use Topics  . Smoking status: Never Smoker   . Smokeless tobacco: Never Used  . Alcohol Use: No     Allergies: No Known Allergies  Meds:  Prescriptions prior to admission  Medication Sig Dispense Refill  . cephALEXin (KEFLEX) 250 MG capsule Take 250 mg by mouth 3 (three) times daily.      . Fe Fum-FePoly-FA-Vit C-Vit B3 (INTEGRA F) 125-1 MG CAPS Take 1 tablet by mouth daily.  30 capsule  1  . Prenatal Vit-Fe Fumarate-FA (PRENATAL MULTIVITAMIN) TABS tablet Take 1 tablet by mouth daily at 12 noon.        ROS: Pertinent findings in history of present illness.  Physical Exam  Blood pressure 135/87, pulse 87, temperature 97.6 F (36.4 C), temperature source Oral, resp. rate 17, height 5\' 3"  (1.6 m), weight 165 lb 6.4 oz (75.025 kg), last menstrual period 07/24/2012, SpO2 100.00%. GENERAL: Well-developed, well-nourished female in no acute distress.  HEENT: normocephalic HEART: normal rate RESP: normal effort ABDOMEN: Soft, non-tender, gravid appropriate for gestational age EXTREMITIES: Nontender, no edema NEURO: alert and oriented SPECULUM EXAM: NEFG, physiologic discharge, no blood, cervix clean Dilation: Closed Effacement (%): Thick Cervical Position: Posterior Exam by:: D. POE CNM    FHT:  Baseline 130 , moderate variability, accelerations present, no decelerations Contractions: none   Labs: Results for orders placed during the hospital encounter of 02/11/13 (from the  past 24 hour(s))  URINALYSIS, ROUTINE W REFLEX MICROSCOPIC     Status: Abnormal   Collection Time    02/11/13  2:31 PM      Result Value Range   Color, Urine YELLOW  YELLOW   APPearance CLOUDY (*) CLEAR   Specific Gravity, Urine 1.020  1.005 - 1.030   pH 6.5  5.0 - 8.0   Glucose, UA NEGATIVE  NEGATIVE mg/dL   Hgb urine dipstick NEGATIVE  NEGATIVE   Bilirubin Urine NEGATIVE  NEGATIVE   Ketones, ur 15 (*) NEGATIVE mg/dL   Protein, ur NEGATIVE  NEGATIVE mg/dL   Urobilinogen, UA 1.0  0.0 - 1.0 mg/dL   Nitrite NEGATIVE  NEGATIVE   Leukocytes, UA LARGE (*) NEGATIVE  URINE MICROSCOPIC-ADD ON     Status:  Abnormal   Collection Time    02/11/13  2:31 PM      Result Value Range   Squamous Epithelial / LPF MANY (*) RARE   WBC, UA 11-20  <3 WBC/hpf   RBC / HPF 0-2  <3 RBC/hpf   Bacteria, UA FEW (*) RARE  C&S sent  IMAU Course: No vomiting or diarrhea  Assessment: [id number] @[redacted]w[redacted]d  N/V heartburn single episode  Plan: Discharge home BRAT diet today. Increase fluids. Tums prn Labor precautions and fetal kick counts    Medication List         cephALEXin 250 MG capsule  Commonly known as:  KEFLEX  Take 250 mg by mouth 3 (three) times daily.     INTEGRA F 125-1 MG Caps  Take 1 tablet by mouth daily.     prenatal multivitamin Tabs tablet  Take 1 tablet by mouth daily at 12 noon.       Follow-up Information   Follow up with [facility]. (Call to confirm your PN appointment next week)    Specialty:  Obstetrics and Gynecology   Contact information:   [address] [place] [address] [phone]     Danae Orleans, CNM 02/11/2013 1:56 PM

## 2013-02-12 LAB — URINE CULTURE

## 2013-02-17 ENCOUNTER — Encounter: Payer: Self-pay | Admitting: Obstetrics & Gynecology

## 2013-02-17 ENCOUNTER — Ambulatory Visit (INDEPENDENT_AMBULATORY_CARE_PROVIDER_SITE_OTHER): Payer: Medicaid Other | Admitting: Obstetrics & Gynecology

## 2013-02-17 VITALS — BP 118/75 | Temp 98.9°F | Wt 167.0 lb

## 2013-02-17 DIAGNOSIS — O0930 Supervision of pregnancy with insufficient antenatal care, unspecified trimester: Secondary | ICD-10-CM

## 2013-02-17 DIAGNOSIS — O0933 Supervision of pregnancy with insufficient antenatal care, third trimester: Secondary | ICD-10-CM | POA: Insufficient documentation

## 2013-02-17 DIAGNOSIS — Z98891 History of uterine scar from previous surgery: Secondary | ICD-10-CM

## 2013-02-17 DIAGNOSIS — O34219 Maternal care for unspecified type scar from previous cesarean delivery: Secondary | ICD-10-CM

## 2013-02-17 LAB — POCT URINALYSIS DIP (DEVICE)
Bilirubin Urine: NEGATIVE
Glucose, UA: NEGATIVE mg/dL
Ketones, ur: NEGATIVE mg/dL
Nitrite: NEGATIVE
pH: 6 (ref 5.0–8.0)

## 2013-02-17 NOTE — Progress Notes (Signed)
U/S scheduled 03/01/13 at 915 am.

## 2013-02-17 NOTE — Progress Notes (Signed)
Pulse 94 Edema trace in feet.

## 2013-02-17 NOTE — Progress Notes (Signed)
Late prenatal care, previous eclampsia, emergency cesarean, preterm birth SVD first pregnancy  Subjective:    [name] [name] is a [id number] [redacted]w[redacted]d being seen today for her first obstetrical visit.  Her obstetrical history is significant for previous eclampsia, pretrm birth, cesarean. Patient does not intend to breast feed. Pregnancy history fully reviewed.  Patient reports no complaints.  Filed Vitals:   02/17/13 0928  BP: 118/75  Temp: 98.9 F (37.2 C)  Weight: 167 lb (75.751 kg)    HISTORY: OB History  Gravida Para Term Preterm AB SAB TAB Ectopic Multiple Living  3 2 0 2 0 0 0 0 0 2     # Outcome Date GA Lbr Len/2nd Weight Sex Delivery Anes PTL Lv  3 CUR           2 PRE 11/08/11 [redacted]w[redacted]d  2 lb 13.9 oz (1.3 kg) M LTCS Spinal N Y     Comments: eclampsia, emergency CS, no prenatal care  1 PRE 12/19/09 107w5d  3 lb 13 oz (1.729 kg) F SVD  Y Y     Past Medical History  Diagnosis Date  . Pregnancy induced hypertension   . Seizures   . Varicose veins    Past Surgical History  Procedure Laterality Date  . Cesarean section  11/08/2011    Procedure: CESAREAN SECTION;  Surgeon: Tereso Newcomer, MD;  Location: [facility];  Service: Gynecology;  Laterality: N/A;  Primary cesarean section with delivery of baby boy at 1620.   Family History  Problem Relation Age of Onset  . Anesthesia problems Neg Hx   . Other Neg Hx   . Diabetes Maternal Grandmother   . Hypertension Maternal Grandmother      Exam    Uterus:  Fundal Height: 33 cm  Pelvic Exam:    Perineum: No Hemorrhoids   Vulva: normal   Vagina:  normal mucosa   pH:     Cervix: no lesions   Adnexa: not evaluated   Bony Pelvis: average  System: Breast:  normal appearance, no masses or tenderness   Skin: normal coloration and turgor, no rashes    Neurologic: oriented, normal   Extremities: normal strength, tone, and muscle mass   HEENT oropharynx clear, no lesions   Mouth/Teeth mucous membranes moist, pharynx normal without  lesions and dental hygiene good   Neck supple   Cardiovascular: regular rate and rhythm   Respiratory:  appears well, vitals normal, no respiratory distress, acyanotic, normal RR, neck free of mass or lymphadenopathy, chest clear, no wheezing, crepitations, rhonchi, normal symmetric air entry   Abdomen: gravid   Urinary: urethral meatus normal      Assessment:    Pregnancy: [id number] Patient Active Problem List   Diagnosis Date Noted  . Late prenatal care complicating pregnancy in third trimester 02/17/2013  . Supervision of high risk pregnancy in third trimester 02/03/2013  . Status post urgent cesarean section for eclampsia 11/08/2011        Plan:     Initial labs drawn. Prenatal vitamins. Problem list reviewed and updated. Genetic Screening discussed  Ultrasound discussed; fetal survey: results reviewed.  Follow up in 1 weeks. 50% of 30 min visit spent on counseling and coordination of care.  Discuss TOLAC   Janelys Glassner 02/17/2013

## 2013-02-17 NOTE — Patient Instructions (Signed)

## 2013-02-17 NOTE — Progress Notes (Signed)
Nutrition note: 1st visit consult Pt has gained 19# @ [redacted]w[redacted]d, which is wnl. Pt reports eating 2-3 meals and 1 snack/d. Pt is taking PNV. Pt reports nausea occ but no vomiting or heartburn. NKFA Pt received verbal & written education on general nutrition during pregnancy. Discussed the benefits of BF. Discussed wt gain goals of 15-25# or 0.6#/wk. Pt agrees to continue taking PNV. Pt has WIC and does not plan to BF but is open to trying in the hospital. F/u if referred Blondell Reveal, MS, RD, LDN

## 2013-02-18 LAB — OBSTETRIC PANEL
Basophils Absolute: 0 10*3/uL (ref 0.0–0.1)
Eosinophils Relative: 1 % (ref 0–5)
Hepatitis B Surface Ag: NEGATIVE
Lymphocytes Relative: 22 % (ref 12–46)
Lymphs Abs: 1.5 10*3/uL (ref 0.7–4.0)
MCV: 67.4 fL — ABNORMAL LOW (ref 78.0–100.0)
Neutro Abs: 4.7 10*3/uL (ref 1.7–7.7)
Neutrophils Relative %: 68 % (ref 43–77)
Platelets: 152 10*3/uL (ref 150–400)
RBC: 3.96 MIL/uL (ref 3.87–5.11)
RDW: 18.3 % — ABNORMAL HIGH (ref 11.5–15.5)
Rubella: 1.28 Index — ABNORMAL HIGH (ref <0.90)
WBC: 6.8 10*3/uL (ref 4.0–10.5)

## 2013-02-18 LAB — HIV ANTIBODY (ROUTINE TESTING W REFLEX): HIV: NONREACTIVE

## 2013-02-19 ENCOUNTER — Encounter: Payer: Self-pay | Admitting: Obstetrics & Gynecology

## 2013-02-19 LAB — CULTURE, OB URINE: Colony Count: 55000

## 2013-02-21 LAB — HEMOGLOBINOPATHY EVALUATION
Hemoglobin Other: 0 %
Hgb F Quant: 0 % (ref 0.0–2.0)
Hgb S Quant: 0 %

## 2013-02-24 ENCOUNTER — Encounter: Payer: Self-pay | Admitting: Family Medicine

## 2013-02-24 ENCOUNTER — Encounter: Payer: Self-pay | Admitting: *Deleted

## 2013-02-26 ENCOUNTER — Encounter: Payer: Self-pay | Admitting: *Deleted

## 2013-03-01 ENCOUNTER — Ambulatory Visit (HOSPITAL_COMMUNITY): Admission: RE | Admit: 2013-03-01 | Payer: Medicaid Other | Source: Ambulatory Visit

## 2013-03-01 ENCOUNTER — Encounter: Payer: Self-pay | Admitting: Obstetrics & Gynecology

## 2013-03-01 ENCOUNTER — Ambulatory Visit (HOSPITAL_COMMUNITY)
Admission: RE | Admit: 2013-03-01 | Discharge: 2013-03-01 | Disposition: A | Discharge status: Home / Self Care | Payer: Medicaid Other | Source: Ambulatory Visit | Attending: Obstetrics & Gynecology | Admitting: Obstetrics & Gynecology

## 2013-03-01 DIAGNOSIS — O0933 Supervision of pregnancy with insufficient antenatal care, third trimester: Secondary | ICD-10-CM

## 2013-03-01 DIAGNOSIS — O0930 Supervision of pregnancy with insufficient antenatal care, unspecified trimester: Secondary | ICD-10-CM | POA: Insufficient documentation

## 2013-03-01 DIAGNOSIS — O09299 Supervision of pregnancy with other poor reproductive or obstetric history, unspecified trimester: Secondary | ICD-10-CM | POA: Insufficient documentation

## 2013-03-01 DIAGNOSIS — O34219 Maternal care for unspecified type scar from previous cesarean delivery: Secondary | ICD-10-CM | POA: Insufficient documentation

## 2013-03-01 DIAGNOSIS — Z8751 Personal history of pre-term labor: Secondary | ICD-10-CM | POA: Insufficient documentation

## 2013-03-31 ENCOUNTER — Ambulatory Visit (INDEPENDENT_AMBULATORY_CARE_PROVIDER_SITE_OTHER): Payer: Medicaid Other | Admitting: Family Medicine

## 2013-03-31 VITALS — BP 132/82 | Temp 96.0°F | Wt 170.4 lb

## 2013-03-31 DIAGNOSIS — Z23 Encounter for immunization: Secondary | ICD-10-CM

## 2013-03-31 DIAGNOSIS — O34219 Maternal care for unspecified type scar from previous cesarean delivery: Secondary | ICD-10-CM

## 2013-03-31 DIAGNOSIS — O0993 Supervision of high risk pregnancy, unspecified, third trimester: Secondary | ICD-10-CM

## 2013-03-31 LAB — OB RESULTS CONSOLE GC/CHLAMYDIA
Chlamydia: NEGATIVE
Gonorrhea: NEGATIVE

## 2013-03-31 LAB — POCT URINALYSIS DIP (DEVICE)
Bilirubin Urine: NEGATIVE
Glucose, UA: NEGATIVE mg/dL
Hgb urine dipstick: NEGATIVE
Nitrite: NEGATIVE
Protein, ur: 30 mg/dL — AB
Urobilinogen, UA: 2 mg/dL — ABNORMAL HIGH (ref 0.0–1.0)
pH: 6 (ref 5.0–8.0)

## 2013-03-31 LAB — OB RESULTS CONSOLE GBS: GBS: POSITIVE

## 2013-03-31 NOTE — Patient Instructions (Signed)
Pregnancy - Third Trimester The third trimester of pregnancy (the last 3 months) is a period of the most rapid growth for you and your baby. The baby approaches a length of 20 inches and a weight of 6 to 10 pounds. The baby is adding on fat and getting ready for life outside your body. While inside, babies have periods of sleeping and waking, sucking thumbs, and hiccuping. You can often feel small contractions of the uterus. This is false labor. It is also called Braxton-Hicks contractions. This is like a practice for labor. The usual problems in this stage of pregnancy include more difficulty breathing, swelling of the hands and feet from water retention, and having to urinate more often because of the uterus and baby pressing on your bladder.  PRENATAL EXAMS  Blood work may continue to be done during prenatal exams. These tests are done to check on your health and the probable health of your baby. Blood work is used to follow your blood levels (hemoglobin). Anemia (low hemoglobin) is common during pregnancy. Iron and vitamins are given to help prevent this. You may also continue to be checked for diabetes. Some of the past blood tests may be done again.  The size of the uterus is measured during each visit. This makes sure your baby is growing properly according to your pregnancy dates.  Your blood pressure is checked every prenatal visit. This is to make sure you are not getting toxemia.  Your urine is checked every prenatal visit for infection, diabetes, and protein.  Your weight is checked at each visit. This is done to make sure gains are happening at the suggested rate and that you and your baby are growing normally.  Sometimes, an ultrasound is performed to confirm the position and the proper growth and development of the baby. This is a test done that bounces harmless sound waves off the baby so your caregiver can more accurately determine a due date.  Discuss the type of pain medicine and  anesthesia you will have during your labor and delivery.  Discuss the possibility and anesthesia if a cesarean section might be necessary.  Inform your caregiver if there is any mental or physical violence at home. Sometimes, a specialized non-stress test, contraction stress test, and biophysical profile are done to make sure the baby is not having a problem. Checking the amniotic fluid surrounding the baby is called an amniocentesis. The amniotic fluid is removed by sticking a needle into the belly (abdomen). This is sometimes done near the end of pregnancy if an early delivery is required. In this case, it is done to help make sure the baby's lungs are mature enough for the baby to live outside of the womb. If the lungs are not mature and it is unsafe to deliver the baby, an injection of cortisone medicine is given to the mother 1 to 2 days before the delivery. This helps the baby's lungs mature and makes it safer to deliver the baby. CHANGES OCCURING IN THE THIRD TRIMESTER OF PREGNANCY Your body goes through many changes during pregnancy. They vary from person to person. Talk to your caregiver about changes you notice and are concerned about.  During the last trimester, you have probably had an increase in your appetite. It is normal to have cravings for certain foods. This varies from person to person and pregnancy to pregnancy.  You may begin to get stretch marks on your hips, abdomen, and breasts. These are normal changes in the body   during pregnancy. There are no exercises or medicines to take which prevent this change.  Constipation may be treated with a stool softener or adding bulk to your diet. Drinking lots of fluids, fiber in vegetables, fruits, and whole grains are helpful.  Exercising is also helpful. If you have been very active up until your pregnancy, most of these activities can be continued during your pregnancy. If you have been less active, it is helpful to start an exercise  program such as walking. Consult your caregiver before starting exercise programs.  Avoid all smoking, alcohol, non-prescribed drugs, herbs and "street drugs" during your pregnancy. These chemicals affect the formation and growth of the baby. Avoid chemicals throughout the pregnancy to ensure the delivery of a healthy infant.  Backache, varicose veins, and hemorrhoids may develop or get worse.  You will tire more easily in the third trimester, which is normal.  The baby's movements may be stronger and more often.  You may become short of breath easily.  Your belly button may stick out.  A yellow discharge may leak from your breasts called colostrum.  You may have a bloody mucus discharge. This usually occurs a few days to a week before labor begins. HOME CARE INSTRUCTIONS   Keep your caregiver's appointments. Follow your caregiver's instructions regarding medicine use, exercise, and diet.  During pregnancy, you are providing food for you and your baby. Continue to eat regular, well-balanced meals. Choose foods such as meat, fish, milk and other low fat dairy products, vegetables, fruits, and whole-grain breads and cereals. Your caregiver will tell you of the ideal weight gain.  A physical sexual relationship may be continued throughout pregnancy if there are no other problems such as early (premature) leaking of amniotic fluid from the membranes, vaginal bleeding, or belly (abdominal) pain.  Exercise regularly if there are no restrictions. Check with your caregiver if you are unsure of the safety of your exercises. Greater weight gain will occur in the last 2 trimesters of pregnancy. Exercising helps:  Control your weight.  Get you in shape for labor and delivery.  You lose weight after you deliver.  Rest a lot with legs elevated, or as needed for leg cramps or low back pain.  Wear a good support or jogging bra for breast tenderness during pregnancy. This may help if worn during  sleep. Pads or tissues may be used in the bra if you are leaking colostrum.  Do not use hot tubs, steam rooms, or saunas.  Wear your seat belt when driving. This protects you and your baby if you are in an accident.  Avoid raw meat, cat litter boxes and soil used by cats. These carry germs that can cause birth defects in the baby.  It is easier to leak urine during pregnancy. Tightening up and strengthening the pelvic muscles will help with this problem. You can practice stopping your urination while you are going to the bathroom. These are the same muscles you need to strengthen. It is also the muscles you would use if you were trying to stop from passing gas. You can practice tightening these muscles up 10 times a set and repeating this about 3 times per day. Once you know what muscles to tighten up, do not perform these exercises during urination. It is more likely to cause an infection by backing up the urine.  Ask for help if you have financial, counseling, or nutritional needs during pregnancy. Your caregiver will be able to offer counseling for these   needs as well as refer you for other special needs.  Make a list of emergency phone numbers and have them available.  Plan on getting help from family or friends when you go home from the hospital.  Make a trial run to the hospital.  Take prenatal classes with the father to understand, practice, and ask questions about the labor and delivery.  Prepare the baby's room or nursery.  Do not travel out of the city unless it is absolutely necessary and with the advice of your caregiver.  Wear only low or no heal shoes to have better balance and prevent falling. MEDICINES AND DRUG USE IN PREGNANCY  Take prenatal vitamins as directed. The vitamin should contain 1 milligram of folic acid. Keep all vitamins out of reach of children. Only a couple vitamins or tablets containing iron may be fatal to a baby or young child when ingested.  Avoid use  of all medicines, including herbs, over-the-counter medicines, not prescribed or suggested by your caregiver. Only take over-the-counter or prescription medicines for pain, discomfort, or fever as directed by your caregiver. Do not use aspirin, ibuprofen or naproxen unless approved by your caregiver.  Let your caregiver also know about herbs you may be using.  Alcohol is related to a number of birth defects. This includes fetal alcohol syndrome. All alcohol, in any form, should be avoided completely. Smoking will cause low birth rate and premature babies.  Illegal drugs are very harmful to the baby. They are absolutely forbidden. A baby born to an addicted mother will be addicted at birth. The baby will go through the same withdrawal an adult does. SEEK MEDICAL CARE IF: You have any concerns or worries during your pregnancy. It is better to call with your questions if you feel they cannot wait, rather than worry about them. SEEK IMMEDIATE MEDICAL CARE IF:   An unexplained oral temperature above 102 F (38.9 C) develops, or as your caregiver suggests.  You have leaking of fluid from the vagina. If leaking membranes are suspected, take your temperature and tell your caregiver of this when you call.  There is vaginal spotting, bleeding or passing clots. Tell your caregiver of the amount and how many pads are used.  You develop a bad smelling vaginal discharge with a change in the color from clear to white.  You develop vomiting that lasts more than 24 hours.  You develop chills or fever.  You develop shortness of breath.  You develop burning on urination.  You loose more than 2 pounds of weight or gain more than 2 pounds of weight or as suggested by your caregiver.  You notice sudden swelling of your face, hands, and feet or legs.  You develop belly (abdominal) pain. Round ligament discomfort is a common non-cancerous (benign) cause of abdominal pain in pregnancy. Your caregiver still  must evaluate you.  You develop a severe headache that does not go away.  You develop visual problems, blurred or double vision.  If you have not felt your baby move for more than 1 hour. If you think the baby is not moving as much as usual, eat something with sugar in it and lie down on your left side for an hour. The baby should move at least 4 to 5 times per hour. Call right away if your baby moves less than that.  You fall, are in a car accident, or any kind of trauma.  There is mental or physical violence at home. Document Released: 05/13/2001   Document Revised: 02/11/2012 Document Reviewed: 11/15/2008 [employer] Patient Information 2014 [employer].  Breastfeeding A change in hormones during your pregnancy causes growth of your breast tissue and an increase in number and size of milk ducts. The hormone prolactin allows proteins, sugars, and fats from your blood supply to make breast milk in your milk-producing glands. The hormone progesterone prevents breast milk from being released before the birth of your baby. After the birth of your baby, your progesterone level decreases allowing breast milk to be released. Thoughts of your baby, as well as his or her sucking or crying, can stimulate the release of milk from the milk-producing glands. Deciding to breastfeed (nurse) is one of the best choices you can make for you and your baby. The information that follows gives a brief review of the benefits, as well as other important skills to know about breastfeeding. BENEFITS OF BREASTFEEDING For your baby  The first milk (colostrum) helps your baby's digestive system function better.   There are antibodies in your milk that help your baby fight off infections.   Your baby has a lower incidence of asthma, allergies, and sudden infant death syndrome (SIDS).   The nutrients in breast milk are better for your baby than infant formulas.  Breast milk improves your baby's brain development.    Your baby will have less gas, colic, and constipation.  Your baby is less likely to develop other conditions, such as childhood obesity, asthma, or diabetes mellitus. For you  Breastfeeding helps develop a very special bond between you and your baby.   Breastfeeding is convenient, always available at the correct temperature, and costs nothing.   Breastfeeding helps to burn calories and helps you lose the weight gained during pregnancy.   Breastfeeding makes your uterus contract back down to normal size faster and slows bleeding following delivery.   Breastfeeding mothers have a lower risk of developing osteoporosis or breast or ovarian cancer later in life.  BREASTFEEDING FREQUENCY  A healthy, full-term baby may breastfeed as often as every hour or space his or her feedings to every 3 hours. Breastfeeding frequency will vary from baby to baby.   Newborns should be fed no less than every 2 3 hours during the day and every 4 5 hours during the night. You should breastfeed a minimum of 8 feedings in a 24 hour period.  Awaken your baby to breastfeed if it has been 3 4 hours since the last feeding.  Breastfeed when you feel the need to reduce the fullness of your breasts or when your newborn shows signs of hunger. Signs that your baby may be hungry include:  Increased alertness or activity.  Stretching.  Movement of the head from side to side.  Movement of the head and opening of the mouth when the corner of the mouth or cheek is stroked (rooting).  Increased sucking sounds, smacking lips, cooing, sighing, or squeaking.  Hand-to-mouth movements.  Increased sucking of fingers or hands.  Fussing.  Intermittent crying.  Signs of extreme hunger will require calming and consoling before you try to feed your baby. Signs of extreme hunger may include:  Restlessness.  A loud, strong cry.  Screaming.  Frequent feeding will help you make more milk and will help prevent  problems, such as sore nipples and engorgement of the breasts.  BREASTFEEDING   Whether lying down or sitting, be sure that the baby's abdomen is facing your abdomen.   Support your breast with 4 fingers under your breast   and your thumb above your nipple. Make sure your fingers are well away from your nipple and your baby's mouth.   Stroke your baby's lips gently with your finger or nipple.   When your baby's mouth is open wide enough, place all of your nipple and as much of the colored area around your nipple (areola) as possible into your baby's mouth.  More areola should be visible above his or her upper lip than below his or her lower lip.  Your baby's tongue should be between his or her lower gum and your breast.  Ensure that your baby's mouth is correctly positioned around the nipple (latched). Your baby's lips should create a seal on your breast.  Signs that your baby has effectively latched onto your nipple include:  Tugging or sucking without pain.  Swallowing heard between sucks.  Absent click or smacking sound.  Muscle movement above and in front of his or her ears with sucking.  Your baby must suck about 2 3 minutes in order to get your milk. Allow your baby to feed on each breast as long as he or she wants. Nurse your baby until he or she unlatches or falls asleep at the first breast, then offer the second breast.  Signs that your baby is full and satisfied include:  A gradual decrease in the number of sucks or complete cessation of sucking.  Falling asleep.  Extension or relaxation of his or her body.  Retention of a small amount of milk in his or her mouth.  Letting go of your breast by himself or herself.  Signs of effective breastfeeding in you include:  Breasts that have increased firmness, weight, and size prior to feeding.  Breasts that are softer after nursing.  Increased milk volume, as well as a change in milk consistency and color by the 5th  day of breastfeeding.  Breast fullness relieved by breastfeeding.  Nipples are not sore, cracked, or bleeding.  If needed, break the suction by putting your finger into the corner of your baby's mouth and sliding your finger between his or her gums. Then, remove your breast from his or her mouth.  It is common for babies to spit up a small amount after a feeding.  Babies often swallow air during feeding. This can make babies fussy. Burping your baby between breasts can help with this.  Vitamin D supplements are recommended for babies who get only breast milk.  Avoid using a pacifier during your baby's first 4 6 weeks.  Avoid supplemental feedings of water, formula, or juice in place of breastfeeding. Breast milk is all the food your baby needs. It is not necessary for your baby to have water or formula. Your breasts will make more milk if supplemental feedings are avoided during the early weeks. HOW TO TELL WHETHER YOUR BABY IS GETTING ENOUGH BREAST MILK Wondering whether or not your baby is getting enough milk is a common concern among mothers. You can be assured that your baby is getting enough milk if:   Your baby is actively sucking and you hear swallowing.   Your baby seems relaxed and satisfied after a feeding.   Your baby nurses at least 8 12 times in a 24 hour time period.  During the first 3 5 days of age:  Your baby is wetting at least 3 5 diapers in a 24 hour period. The urine should be clear and pale yellow.  Your baby is having at least 3 4 stools in   a 24 hour period. The stool should be soft and yellow.  At 5 7 days of age, your baby is having at least 3 6 stools in a 24 hour period. The stool should be seedy and yellow by 5 days of age.  Your baby has a weight loss less than 7 10% during the first 3 days of age.  Your baby does not lose weight after 3 7 days of age.  Your baby gains 4 7 ounces each week after he or she is 4 days of age.  Your baby gains weight  by 5 days of age and is back to birth weight within 2 weeks. ENGORGEMENT In the first week after your baby is born, you may experience extremely full breasts (engorgement). When engorged, your breasts may feel heavy, warm, or tender to the touch. Engorgement peaks within 24 48 hours after delivery of your baby.  Engorgement may be reduced by:  Continuing to breastfeed.  Increasing the frequency of breastfeeding.  Taking warm showers or applying warm, moist heat to your breasts just before each feeding. This increases circulation and helps the milk flow.   Gently massaging your breast before and during the feedings. With your fingertips, massage from your chest wall towards your nipple in a circular motion.   Ensuring that your baby empties at least one breast at every feeding. It also helps to start the next feeding on the opposite breast.   Expressing breast milk by hand or by using a breast pump to empty the breasts if your baby is sleepy, or not nursing well. You may also want to express milk if you are returning to work oryou feel you are getting engorged.  Ensuring your baby is latched on and positioned properly while breastfeeding. If you follow these suggestions, your engorgement should improve in 24 48 hours. If you are still experiencing difficulty, call your lactation consultant or caregiver.  CARING FOR YOURSELF Take care of your breasts.  Bathe or shower daily.   Avoid using soap on your nipples.   Wear a supportive bra. Avoid wearing underwire style bras.  Air dry your nipples for a 3 4minutes after each feeding.   Use only cotton bra pads to absorb breast milk leakage. Leaking of breast milk between feedings is normal.   Use only pure lanolin on your nipples after nursing. You do not need to wash it off before feeding your baby again. Another option is to express a few drops of breast milk and gently massage that milk into your nipples.  Continue breast  self-awareness checks. Take care of yourself.  Eat healthy foods. Alternate 3 meals with 3 snacks.  Avoid foods that you notice affect your baby in a bad way.  Drink milk, fruit juice, and water to satisfy your thirst (about 8 glasses a day).   Rest often, relax, and take your prenatal vitamins to prevent fatigue, stress, and anemia.  Avoid chewing and smoking tobacco.  Avoid alcohol and drug use.  Take over-the-counter and prescribed medicine only as directed by your caregiver or pharmacist. You should always check with your caregiver or pharmacist before taking any new medicine, vitamin, or herbal supplement.  Know that pregnancy is possible while breastfeeding. If desired, talk to your caregiver about family planning and safe birth control methods that may be used while breastfeeding. SEEK MEDICAL CARE IF:   You feel like you want to stop breastfeeding or have become frustrated with breastfeeding.  You have painful breasts or nipples.    Your nipples are cracked or bleeding.  Your breasts are red, tender, or warm.  You have a swollen area on either breast.  You have a fever or chills.  You have nausea or vomiting.  You have drainage from your nipples.  Your breasts do not become full before feedings by the 5th day after delivery.  You feel sad and depressed.  Your baby is too sleepy to eat well.  Your baby is having trouble sleeping.   Your baby is wetting less than 3 diapers in a 24 hour period.  Your baby has less than 3 stools in a 24 hour period.  Your baby's skin or the white part of his or her eyes becomes more yellow.   Your baby is not gaining weight by 5 days of age. MAKE SURE YOU:   Understand these instructions.  Will watch your condition.  Will get help right away if you are not doing well or get worse. Document Released: 05/19/2005 Document Revised: 02/11/2012 Document Reviewed: 12/24/2011 [employer] Patient Information 2014 [employer].  

## 2013-03-31 NOTE — Progress Notes (Signed)
Lapse in care--no visit in 6 wks Needs f/u u/s for marginal cord insertion, poor dating Desires VBAC--risks discussed-consent signed. Cultures today Flu and TDaP today

## 2013-03-31 NOTE — Progress Notes (Signed)
Patient  Signed VBAC papers. U/S scheduled 04/01/13 at 1115 am.

## 2013-03-31 NOTE — Progress Notes (Signed)
Pulse- 106  Pressure/pain-back, "uterus" Consented for flu vaccine

## 2013-04-01 ENCOUNTER — Ambulatory Visit (HOSPITAL_COMMUNITY)
Admission: RE | Admit: 2013-04-01 | Discharge: 2013-04-01 | Disposition: A | Discharge status: Home / Self Care | Payer: Medicaid Other | Source: Ambulatory Visit | Attending: Family Medicine | Admitting: Family Medicine

## 2013-04-01 ENCOUNTER — Other Ambulatory Visit: Payer: Self-pay | Admitting: Family Medicine

## 2013-04-01 DIAGNOSIS — O34219 Maternal care for unspecified type scar from previous cesarean delivery: Secondary | ICD-10-CM

## 2013-04-01 DIAGNOSIS — IMO0001 Reserved for inherently not codable concepts without codable children: Secondary | ICD-10-CM | POA: Insufficient documentation

## 2013-04-01 DIAGNOSIS — Z3689 Encounter for other specified antenatal screening: Secondary | ICD-10-CM | POA: Insufficient documentation

## 2013-04-01 LAB — GC/CHLAMYDIA PROBE AMP: CT Probe RNA: NEGATIVE

## 2013-04-02 ENCOUNTER — Encounter (HOSPITAL_COMMUNITY): Payer: Self-pay

## 2013-04-02 ENCOUNTER — Inpatient Hospital Stay (HOSPITAL_COMMUNITY)
Admission: AD | Admit: 2013-04-02 | Discharge: 2013-04-02 | Disposition: A | Discharge status: Home / Self Care | Payer: Medicaid Other | Source: Ambulatory Visit | Attending: Obstetrics and Gynecology | Admitting: Obstetrics and Gynecology

## 2013-04-02 DIAGNOSIS — O34219 Maternal care for unspecified type scar from previous cesarean delivery: Secondary | ICD-10-CM

## 2013-04-02 DIAGNOSIS — O479 False labor, unspecified: Secondary | ICD-10-CM | POA: Insufficient documentation

## 2013-04-02 DIAGNOSIS — O0993 Supervision of high risk pregnancy, unspecified, third trimester: Secondary | ICD-10-CM

## 2013-04-02 NOTE — MAU Note (Signed)
Patient reports with contractions. Patient reports pain as a 5. Patient was checked in the clinic Thursday and was dilated 1 cm.

## 2013-04-04 ENCOUNTER — Encounter: Payer: Self-pay | Admitting: Family Medicine

## 2013-04-04 DIAGNOSIS — O99820 Streptococcus B carrier state complicating pregnancy: Secondary | ICD-10-CM | POA: Insufficient documentation

## 2013-04-06 ENCOUNTER — Encounter: Payer: Self-pay | Admitting: *Deleted

## 2013-04-07 ENCOUNTER — Ambulatory Visit (INDEPENDENT_AMBULATORY_CARE_PROVIDER_SITE_OTHER): Payer: Medicaid Other | Admitting: Obstetrics & Gynecology

## 2013-04-07 VITALS — BP 124/79 | Temp 98.5°F | Wt 169.2 lb

## 2013-04-07 DIAGNOSIS — Z3480 Encounter for supervision of other normal pregnancy, unspecified trimester: Secondary | ICD-10-CM

## 2013-04-07 DIAGNOSIS — O480 Post-term pregnancy: Secondary | ICD-10-CM

## 2013-04-07 LAB — POCT URINALYSIS DIP (DEVICE)
Glucose, UA: NEGATIVE mg/dL
Ketones, ur: 15 mg/dL — AB
Protein, ur: 30 mg/dL — AB
Specific Gravity, Urine: >=1.03 (ref 1.005–1.030)
pH: 6.5 (ref 5.0–8.0)

## 2013-04-07 NOTE — Progress Notes (Signed)
IOL scheduled 11/13/ 14 at 730 am. ( no pm appts. available)

## 2013-04-07 NOTE — Progress Notes (Signed)
P=90, 

## 2013-04-07 NOTE — Progress Notes (Signed)
NST reactive.

## 2013-04-08 ENCOUNTER — Telehealth (HOSPITAL_COMMUNITY): Payer: Self-pay | Admitting: *Deleted

## 2013-04-08 ENCOUNTER — Encounter (HOSPITAL_COMMUNITY): Payer: Self-pay | Admitting: *Deleted

## 2013-04-08 NOTE — Telephone Encounter (Signed)
Preadmission screen  

## 2013-04-12 ENCOUNTER — Other Ambulatory Visit: Payer: Medicaid Other

## 2013-04-13 ENCOUNTER — Encounter (HOSPITAL_COMMUNITY): Payer: Self-pay | Admitting: *Deleted

## 2013-04-13 ENCOUNTER — Encounter (HOSPITAL_COMMUNITY): Payer: Medicaid Other | Admitting: Anesthesiology

## 2013-04-13 ENCOUNTER — Inpatient Hospital Stay (HOSPITAL_COMMUNITY)
Admission: AD | Admit: 2013-04-13 | Discharge: 2013-04-15 | DRG: 767 | Disposition: A | Discharge status: Home / Self Care | Payer: Medicaid Other | Source: Ambulatory Visit | Attending: Obstetrics & Gynecology | Admitting: Obstetrics & Gynecology

## 2013-04-13 ENCOUNTER — Inpatient Hospital Stay (HOSPITAL_COMMUNITY): Payer: Medicaid Other | Admitting: Anesthesiology

## 2013-04-13 DIAGNOSIS — O34219 Maternal care for unspecified type scar from previous cesarean delivery: Secondary | ICD-10-CM

## 2013-04-13 DIAGNOSIS — Z22330 Carrier of Group B streptococcus: Secondary | ICD-10-CM

## 2013-04-13 DIAGNOSIS — O9989 Other specified diseases and conditions complicating pregnancy, childbirth and the puerperium: Secondary | ICD-10-CM

## 2013-04-13 DIAGNOSIS — O99892 Other specified diseases and conditions complicating childbirth: Secondary | ICD-10-CM | POA: Diagnosis present

## 2013-04-13 DIAGNOSIS — O0993 Supervision of high risk pregnancy, unspecified, third trimester: Secondary | ICD-10-CM

## 2013-04-13 DIAGNOSIS — Z302 Encounter for sterilization: Secondary | ICD-10-CM

## 2013-04-13 DIAGNOSIS — O99820 Streptococcus B carrier state complicating pregnancy: Secondary | ICD-10-CM

## 2013-04-13 LAB — RAPID URINE DRUG SCREEN, HOSP PERFORMED
Benzodiazepines: NOT DETECTED
Cocaine: NOT DETECTED

## 2013-04-13 LAB — CBC
HCT: 29.8 % — ABNORMAL LOW (ref 36.0–46.0)
Hemoglobin: 9.4 g/dL — ABNORMAL LOW (ref 12.0–15.0)
MCHC: 31.5 g/dL (ref 30.0–36.0)
Platelets: 136 10*3/uL — ABNORMAL LOW (ref 150–400)
RBC: 4.4 MIL/uL (ref 3.87–5.11)

## 2013-04-13 LAB — TYPE AND SCREEN: ABO/RH(D): A POS

## 2013-04-13 MED ORDER — OXYTOCIN BOLUS FROM INFUSION: 500.0000 mL | INTRAVENOUS | Status: DC

## 2013-04-13 MED ORDER — FLEET ENEMA 7-19 GM/118ML RE ENEM: 1.0000 | ENEMA | Freq: Once | RECTAL | Status: DC

## 2013-04-13 MED ORDER — LACTATED RINGERS IV SOLN: 500.0000 mL | INTRAVENOUS | Status: DC | PRN

## 2013-04-13 MED ORDER — ACETAMINOPHEN 325 MG PO TABS: 650.0000 mg | ORAL_TABLET | ORAL | Status: DC | PRN

## 2013-04-13 MED ORDER — LIDOCAINE HCL (PF) 1 % IJ SOLN
INTRAMUSCULAR | Status: DC | PRN
  Administered 2013-04-13 (×2): 4 mL

## 2013-04-13 MED ORDER — PENICILLIN G POTASSIUM 5000000 UNITS IJ SOLR
2.5000 10*6.[IU] | INTRAVENOUS | Status: DC
  Filled 2013-04-13 (×3): qty 2.5

## 2013-04-13 MED ORDER — LACTATED RINGERS IV SOLN: 500.0000 mL | Freq: Once | INTRAVENOUS | Status: DC

## 2013-04-13 MED ORDER — PHENYLEPHRINE 40 MCG/ML (10ML) SYRINGE FOR IV PUSH (FOR BLOOD PRESSURE SUPPORT)
80.0000 ug | PREFILLED_SYRINGE | INTRAVENOUS | Status: DC | PRN
  Filled 2013-04-13: qty 2

## 2013-04-13 MED ORDER — ONDANSETRON 4 MG PO TBDP: 4.0000 mg | ORAL_TABLET | Freq: Once | ORAL | Status: DC

## 2013-04-13 MED ORDER — FENTANYL 2.5 MCG/ML BUPIVACAINE 1/10 % EPIDURAL INFUSION (WH - ANES)
14.0000 mL/h | INTRAMUSCULAR | Status: DC | PRN
  Filled 2013-04-13: qty 125

## 2013-04-13 MED ORDER — OXYTOCIN 10 UNIT/ML IJ SOLN
INTRAMUSCULAR | Status: AC
  Filled 2013-04-13: qty 1

## 2013-04-13 MED ORDER — FENTANYL 2.5 MCG/ML BUPIVACAINE 1/10 % EPIDURAL INFUSION (WH - ANES)
INTRAMUSCULAR | Status: DC | PRN
  Administered 2013-04-13: 14 mL/h via EPIDURAL

## 2013-04-13 MED ORDER — EPHEDRINE 5 MG/ML INJ
10.0000 mg | INTRAVENOUS | Status: DC | PRN
  Filled 2013-04-13: qty 2

## 2013-04-13 MED ORDER — CITRIC ACID-SODIUM CITRATE 334-500 MG/5ML PO SOLN: 30.0000 mL | ORAL | Status: DC | PRN

## 2013-04-13 MED ORDER — IBUPROFEN 600 MG PO TABS
600.0000 mg | ORAL_TABLET | Freq: Four times a day (QID) | ORAL | Status: DC | PRN
  Administered 2013-04-13: 600 mg via ORAL
  Filled 2013-04-13: qty 1

## 2013-04-13 MED ORDER — DIPHENHYDRAMINE HCL 50 MG/ML IJ SOLN: 12.5000 mg | INTRAMUSCULAR | Status: DC | PRN

## 2013-04-13 MED ORDER — PENICILLIN G POTASSIUM 5000000 UNITS IJ SOLR
5.0000 10*6.[IU] | Freq: Once | INTRAVENOUS | Status: AC
  Administered 2013-04-13: 5 10*6.[IU] via INTRAVENOUS
  Filled 2013-04-13: qty 5

## 2013-04-13 MED ORDER — LIDOCAINE HCL (PF) 1 % IJ SOLN
30.0000 mL | INTRAMUSCULAR | Status: DC | PRN
  Filled 2013-04-13 (×2): qty 30

## 2013-04-13 MED ORDER — ONDANSETRON HCL 4 MG/2ML IJ SOLN
4.0000 mg | Freq: Four times a day (QID) | INTRAMUSCULAR | Status: DC | PRN
  Administered 2013-04-13: 4 mg via INTRAVENOUS
  Filled 2013-04-13: qty 2

## 2013-04-13 MED ORDER — OXYCODONE-ACETAMINOPHEN 5-325 MG PO TABS: 1.0000 | ORAL_TABLET | ORAL | Status: DC | PRN

## 2013-04-13 MED ORDER — LACTATED RINGERS IV SOLN
INTRAVENOUS | Status: DC
  Administered 2013-04-13 (×2): via INTRAVENOUS

## 2013-04-13 MED ORDER — OXYTOCIN 40 UNITS IN LACTATED RINGERS INFUSION - SIMPLE MED
62.5000 mL/h | INTRAVENOUS | Status: DC
  Filled 2013-04-13: qty 1000

## 2013-04-13 MED ORDER — EPHEDRINE 5 MG/ML INJ
10.0000 mg | INTRAVENOUS | Status: DC | PRN
  Filled 2013-04-13: qty 4
  Filled 2013-04-13: qty 2

## 2013-04-13 MED ORDER — PHENYLEPHRINE 40 MCG/ML (10ML) SYRINGE FOR IV PUSH (FOR BLOOD PRESSURE SUPPORT)
80.0000 ug | PREFILLED_SYRINGE | INTRAVENOUS | Status: DC | PRN
  Filled 2013-04-13: qty 10
  Filled 2013-04-13: qty 2

## 2013-04-13 NOTE — H&P (Signed)
[name] [name] is a 27 y.o. female [phone] with IUP at [redacted]w[redacted]d by 31wk ultrasound presenting for contractions and possible LOF. Pt reports episode of bloody show and thin white mucous this morning. Since that time she has had contractions. They are increasing in intensity and are currently occurring every 5 mins. She reports continued leakage of clear mucus for which she is wearing a pad. She denies any gush of fluid. She endorses bloody show this morning but denies frank vaginal bleeding. She reports normal fetal movement. She denies any complications with this pregnancy. She has a history of a prior preterm vaginal delivery at 31wks followed by a preterm delivery around 30 wks by c-section due to eclamptic seizure. She desires a TOLAC for this pregnancy and has signed the necessary consent forms. She denies headache, vision changes, RUQ abdominal pain and extremity swelling.   P[facility]are at [facility] since 33 wks. She initiated [facility] too late for genetic screen. Her anatomic ultrasound showed marginal cord insertion but was otherwise normal. This was redemonstrated on 39wk ultrasound. Her 1 hr GTT was normal. She is GBS positive.   Past Medical History: Past Medical History  Diagnosis Date  . Pregnancy induced hypertension   . Seizures   . Varicose veins   . Hx of pre-eclampsia in prior pregnancy, currently pregnant     Eclampsia    Past Surgical History: Past Surgical History  Procedure Laterality Date  . Cesarean section  11/08/2011    Procedure: CESAREAN SECTION;  Surgeon: Tereso Newcomer, MD;  Location: [facility];  Service: Gynecology;  Laterality: N/A;  Primary cesarean section with delivery of baby boy at 1620.    Obstetrical History: OB History   Grav Para Term Preterm Abortions TAB SAB Ect Mult Living   3 2 0 2 0 0 0 0 0 2       Gynecological History: OB History   Grav Para Term Preterm Abortions TAB SAB Ect Mult Living   3 2 0 2 0 0 0 0 0 2       Social History: History   Social  History  . Marital Status: Single    Spouse Name: N/A    Number of Children: N/A  . Years of Education: N/A   Social History Main Topics  . Smoking status: Never Smoker   . Smokeless tobacco: Never Used  . Alcohol Use: No  . Drug Use: No  . Sexual Activity: Not Currently   Other Topics Concern  . None   Social History Narrative  . None    Family History: Family History  Problem Relation Age of Onset  . Anesthesia problems Neg Hx   . Other Neg Hx   . Diabetes Maternal Grandmother   . Hypertension Maternal Grandmother     Allergies: No Known Allergies  Prescriptions prior to admission  Medication Sig Dispense Refill  . acetaminophen (TYLENOL) 500 MG tablet Take 1,000 mg by mouth every 6 (six) hours as needed.      . Fe Fum-FePoly-FA-Vit C-Vit B3 (INTEGRA F) 125-1 MG CAPS Take 1 tablet by mouth daily.  30 capsule  1  . Prenatal Vit-Fe Fumarate-FA (PRENATAL MULTIVITAMIN) TABS tablet Take 1 tablet by mouth daily at 12 noon.         Review of Systems  As per HPI    Blood pressure 137/88, pulse 87, temperature 97.8 F (36.6 C), temperature source Oral, resp. rate 18, height 5\' 3"  (1.6 m), weight 76.658 kg (169 lb), last menstrual period 07/24/2012. General  appearance: alert, cooperative and appears very uncomfortable with contractions Lungs: clear to auscultation bilaterally Heart: regular rate and rhythm Abdomen: soft, non-tender; bowel sounds normal, EFW 7.5 lbs by [place].  Pelvic: + pooling, negative ferning however pt uncooperative with exam due to discomfort.  Extremities: Homans sign is negative, no sign of DVT Presentation: cephalic Fetal monitoring: Baseline 140 bpm, moderate variability, + accels, occasional variable and 1 early. It is difficult to trace baby during contractions due to maternal movement and discomfort.  Uterine activity: q2-3 mins Dilation: 3 Effacement (%): 70 Station: -3 Exam by:: Cowart   Prenatal labs: ABO, Rh: A/POS/-- (09/18  1042) Antibody: NEG (09/18 1042) Rubella:  Immune (9/18) RPR: NON REAC (09/18 1042)  HBsAg: NEGATIVE (09/18 1042)  HIV: NON REACTIVE (09/18 1042)  GBS: Positive (10/30 0000)  1 hr Glucola: 86 Genetic screening: presented to [facility] too late Anatomy US marginal cord insertion, otherwise normal Repeat ultrasound 10/31 @ [redacted]w[redacted]d again showed marginal cord insertion, EFW 3123 grams (42%ile)  Results for orders placed during the hospital encounter of 04/13/13 (from the past 12 hour(s))  CBC   Collection Time    04/13/13  3:05 PM      Result Value Range   WBC 7.5  4.0 - 10.5 K/uL   RBC 4.40  3.87 - 5.11 MIL/uL   Hemoglobin 9.4 (*) 12.0 - 15.0 g/dL   HCT 29.5 (*) 28.4 - 13.2 %   MCV 67.7 (*) 78.0 - 100.0 fL   MCH 21.4 (*) 26.0 - 34.0 pg   MCHC 31.5  30.0 - 36.0 g/dL   RDW 44.0 (*) 10.2 - 72.5 %   Platelets 136 (*) 150 - 400 K/uL  RAPID HIV SCREEN [facility])   Collection Time    04/13/13  3:05 PM      Result Value Range   SUDS Rapid HIV Screen NON REACTIVE  NON REACTIVE    Assessment: [name] [name] is a 27 y.o. [id number] with an IUP at [redacted]w[redacted]d presenting for term early labor and TOLAC  Plan: 1. Admit to Labor and Delivery. Routine orders placed.  2. Labor: Early but progressing spontaneously at this time. Pt very uncomfortable and contractions now q2 mins. Monitor for continued cervical progression.  3. GBS positive. Start Penicillin per protocol  4. She desires epidural for pain control. Anesthesia notified.  5. History if eclamptic seizure with prior pregnancy: BP 120s-130s/80s currently. She denies any symptoms of pre-eclampsia. Monitor BP through labor and postpartum period.      Hal Neer, MD 04/13/2013, 3:55 PM  I have seen and examined this patient and agree with above documentation in the resident's note. Cat I tracing. GBS pos but on PCN. Anticipate SVD.   Rulon Abide, M.D. [facility] Fellow 04/13/2013 7:48 PM

## 2013-04-13 NOTE — Anesthesia Preprocedure Evaluation (Addendum)
Anesthesia Evaluation  Patient identified by MRN, date of birth, ID band Patient awake    Reviewed: Allergy & Precautions, H&P , Patient's Chart, lab work & pertinent test results  Airway Mallampati: II TM Distance: >3 FB Neck ROM: full    Dental no notable dental hx. (+) Teeth Intact   Pulmonary  breath sounds clear to auscultation  Pulmonary exam normal       Cardiovascular hypertension, + CABG Rhythm:regular Rate:Normal  Pre eclampsia in previous pregnancy   Neuro/Psych Seizures -,  negative psych ROS   GI/Hepatic negative GI ROS,   Endo/Other  negative endocrine ROS  Renal/GU negative Renal ROS  negative genitourinary   Musculoskeletal   Abdominal Normal abdominal exam  (+)   Peds  Hematology negative hematology ROS (+)   Anesthesia Other Findings   Reproductive/Obstetrics (+) Pregnancy Desires Post partum BTL                          Anesthesia Physical Anesthesia Plan  ASA: II  Anesthesia Plan: Epidural   Post-op Pain Management:    Induction:   Airway Management Planned:   Additional Equipment:   Intra-op Plan:   Post-operative Plan:   Informed Consent: I have reviewed the patients History and Physical, chart, labs and discussed the procedure including the risks, benefits and alternatives for the proposed anesthesia with the patient or authorized representative who has indicated his/her understanding and acceptance.     Plan Discussed with: Anesthesiologist  Anesthesia Plan Comments:         Anesthesia Quick Evaluation

## 2013-04-13 NOTE — Progress Notes (Signed)
[name] [name] is a 27 y.o. [id number] at [redacted]w[redacted]d by 31wk [place]  admitted for active labor  Subjective: Feeling increased pelvic pressure and contraction pain S/p epidural S/p SROM with thin mec Hx of eclampsia with prev preg no sx/signs of pre-e at this time  Objective: BP 135/86  Pulse 104  Temp(Src) 98.6 F (37 C) (Axillary)  Resp 20  Ht 5\' 3"  (1.6 m)  Wt 76.658 kg (169 lb)  BMI 29.94 kg/m2  SpO2 100%  LMP 07/24/2012      FHT:  FHR: 160 bpm, variability: moderate,  accelerations:  Abscent,  decelerations:  Present late UC:   q1-3 SVE:   Dilation: 8 Effacement (%): 90 Station: 0 Exam by:: dr. Michail [name]  Labs: Lab Results  Component Value Date   WBC 7.5 04/13/2013   HGB 9.4* 04/13/2013   HCT 29.8* 04/13/2013   MCV 67.7* 04/13/2013   PLT 136* 04/13/2013    Assessment / Plan: Spontaneous labor, progressing normally, rapid change within the last 2hr period  Labor: Progressing normally without augmentation Preeclampsia:  no signs or symptoms of toxicity and labs stable, trending BP, no severe range Fetal Wellbeing:  Category II s/p positional changes and o2 placement Pain Control:  Epidural I/D:  GBS post- on PCN Anticipated MOD:  NSVD  [name] [name] 04/13/2013, 9:11 PM

## 2013-04-13 NOTE — Anesthesia Procedure Notes (Signed)
Epidural Patient location during procedure: OB Start time: 04/13/2013 4:37 PM  Staffing Anesthesiologist: Mollye Guinta A. Performed by: anesthesiologist   Preanesthetic Checklist Completed: patient identified, site marked, surgical consent, pre-op evaluation, timeout performed, IV checked, risks and benefits discussed and monitors and equipment checked  Epidural Patient position: sitting Prep: site prepped and draped and DuraPrep Patient monitoring: continuous pulse ox and blood pressure Approach: midline Injection technique: LOR air  Needle:  Needle type: Tuohy  Needle gauge: 17 G Needle length: 9 cm and 9 Needle insertion depth: 4 cm Catheter type: closed end flexible Catheter size: 19 Gauge Catheter at skin depth: 9 cm Test dose: negative and Other  Assessment Events: blood not aspirated, injection not painful, no injection resistance, negative IV test and no paresthesia  Additional Notes Patient identified. Risks and benefits discussed including failed block, incomplete  Pain control, post dural puncture headache, nerve damage, paralysis, blood pressure Changes, nausea, vomiting, reactions to medications-both toxic and allergic and post Partum back pain. All questions were answered. Patient expressed understanding and wished to proceed. Sterile technique was used throughout procedure. Epidural site was Dressed with sterile barrier dressing. No paresthesias, signs of intravascular injection Or signs of intrathecal spread were encountered.  Patient was more comfortable after the epidural was dosed. Please see RN's note for documentation of vital signs and FHR which are stable.

## 2013-04-13 NOTE — MAU Note (Signed)
Pt presents with complaints of leakage of fluid around 10 this morning with contractions starting shortly after. She is scheduled for an induction in the morning

## 2013-04-13 NOTE — Progress Notes (Signed)
Dr [place] Sink called and I ask if pt was going to be admitted, she states to admit pt.

## 2013-04-14 ENCOUNTER — Encounter (HOSPITAL_COMMUNITY): Payer: Self-pay | Admitting: Registered Nurse

## 2013-04-14 ENCOUNTER — Encounter (HOSPITAL_COMMUNITY): Payer: Medicaid Other | Admitting: Registered Nurse

## 2013-04-14 ENCOUNTER — Inpatient Hospital Stay (HOSPITAL_COMMUNITY): Payer: Medicaid Other | Admitting: Registered Nurse

## 2013-04-14 ENCOUNTER — Encounter (HOSPITAL_COMMUNITY)
Admission: AD | Disposition: A | Discharge status: Home / Self Care | Payer: Self-pay | Source: Ambulatory Visit | Attending: Obstetrics & Gynecology

## 2013-04-14 ENCOUNTER — Inpatient Hospital Stay (HOSPITAL_COMMUNITY): Admission: RE | Admit: 2013-04-14 | Payer: Medicaid Other | Source: Ambulatory Visit

## 2013-04-14 DIAGNOSIS — Z302 Encounter for sterilization: Secondary | ICD-10-CM

## 2013-04-14 DIAGNOSIS — O34219 Maternal care for unspecified type scar from previous cesarean delivery: Secondary | ICD-10-CM

## 2013-04-14 DIAGNOSIS — O9989 Other specified diseases and conditions complicating pregnancy, childbirth and the puerperium: Secondary | ICD-10-CM

## 2013-04-14 HISTORY — PX: TUBAL LIGATION: SHX77

## 2013-04-14 LAB — COMPREHENSIVE METABOLIC PANEL
ALT: 8 U/L (ref 0–35)
AST: 19 U/L (ref 0–37)
Albumin: 2.3 g/dL — ABNORMAL LOW (ref 3.5–5.2)
BUN: 5 mg/dL — ABNORMAL LOW (ref 6–23)
Calcium: 9 mg/dL (ref 8.4–10.5)
Chloride: 105 mEq/L (ref 96–112)
Creatinine, Ser: 0.84 mg/dL (ref 0.50–1.10)
Sodium: 137 mEq/L (ref 135–145)

## 2013-04-14 LAB — PROTEIN / CREATININE RATIO, URINE
Protein Creatinine Ratio: 0.13 (ref 0.00–0.15)
Total Protein, Urine: 20.8 mg/dL

## 2013-04-14 SURGERY — LIGATION, FALLOPIAN TUBE, POSTPARTUM
Anesthesia: Epidural | Site: Abdomen | Laterality: Bilateral | Wound class: Clean

## 2013-04-14 MED ORDER — MIDAZOLAM HCL 5 MG/5ML IJ SOLN
INTRAMUSCULAR | Status: DC | PRN
  Administered 2013-04-14 (×2): 1 mg via INTRAVENOUS

## 2013-04-14 MED ORDER — 0.9 % SODIUM CHLORIDE (POUR BTL) OPTIME
TOPICAL | Status: DC | PRN
  Administered 2013-04-14: 1000 mL

## 2013-04-14 MED ORDER — ZOLPIDEM TARTRATE 5 MG PO TABS: 5.0000 mg | ORAL_TABLET | Freq: Every evening | ORAL | Status: DC | PRN

## 2013-04-14 MED ORDER — LIDOCAINE-EPINEPHRINE (PF) 2 %-1:200000 IJ SOLN
INTRAMUSCULAR | Status: AC
  Filled 2013-04-14: qty 20

## 2013-04-14 MED ORDER — LACTATED RINGERS IV SOLN
INTRAVENOUS | Status: DC
  Administered 2013-04-14 (×2): via INTRAVENOUS

## 2013-04-14 MED ORDER — PROPOFOL 10 MG/ML IV EMUL
INTRAVENOUS | Status: AC
  Filled 2013-04-14: qty 20

## 2013-04-14 MED ORDER — FENTANYL CITRATE 0.05 MG/ML IJ SOLN
INTRAMUSCULAR | Status: AC
  Filled 2013-04-14: qty 2

## 2013-04-14 MED ORDER — HYDRALAZINE HCL 20 MG/ML IJ SOLN: 10.0000 mg | INTRAMUSCULAR | Status: DC | PRN

## 2013-04-14 MED ORDER — SENNOSIDES-DOCUSATE SODIUM 8.6-50 MG PO TABS
2.0000 | ORAL_TABLET | ORAL | Status: DC
  Administered 2013-04-14 (×2): 2 via ORAL
  Filled 2013-04-14 (×2): qty 2

## 2013-04-14 MED ORDER — BUPIVACAINE HCL (PF) 0.25 % IJ SOLN
INTRAMUSCULAR | Status: DC | PRN
  Administered 2013-04-14: 8 mL

## 2013-04-14 MED ORDER — KETOROLAC TROMETHAMINE 30 MG/ML IJ SOLN
INTRAMUSCULAR | Status: AC
  Filled 2013-04-14: qty 1

## 2013-04-14 MED ORDER — MIDAZOLAM HCL 2 MG/2ML IJ SOLN
INTRAMUSCULAR | Status: AC
  Filled 2013-04-14: qty 2

## 2013-04-14 MED ORDER — BENZOCAINE-MENTHOL 20-0.5 % EX AERO: 1.0000 "application " | INHALATION_SPRAY | CUTANEOUS | Status: DC | PRN

## 2013-04-14 MED ORDER — LANOLIN HYDROUS EX OINT: TOPICAL_OINTMENT | CUTANEOUS | Status: DC | PRN

## 2013-04-14 MED ORDER — PROPOFOL 10 MG/ML IV BOLUS
INTRAVENOUS | Status: DC | PRN
  Administered 2013-04-14 (×5): 20 mg via INTRAVENOUS

## 2013-04-14 MED ORDER — TETANUS-DIPHTH-ACELL PERTUSSIS 5-2.5-18.5 LF-MCG/0.5 IM SUSP: 0.5000 mL | Freq: Once | INTRAMUSCULAR | Status: DC

## 2013-04-14 MED ORDER — IBUPROFEN 600 MG PO TABS
600.0000 mg | ORAL_TABLET | Freq: Four times a day (QID) | ORAL | Status: DC
  Administered 2013-04-14 – 2013-04-15 (×5): 600 mg via ORAL
  Filled 2013-04-14 (×5): qty 1

## 2013-04-14 MED ORDER — ONDANSETRON HCL 4 MG/2ML IJ SOLN
INTRAMUSCULAR | Status: DC | PRN
  Administered 2013-04-14: 4 mg via INTRAVENOUS

## 2013-04-14 MED ORDER — SODIUM BICARBONATE 8.4 % IV SOLN
INTRAVENOUS | Status: AC
  Filled 2013-04-14: qty 50

## 2013-04-14 MED ORDER — FENTANYL CITRATE 0.05 MG/ML IJ SOLN: 25.0000 ug | INTRAMUSCULAR | Status: DC | PRN

## 2013-04-14 MED ORDER — PRENATAL MULTIVITAMIN CH
1.0000 | ORAL_TABLET | Freq: Every day | ORAL | Status: DC
  Administered 2013-04-14 – 2013-04-15 (×2): 1 via ORAL
  Filled 2013-04-14 (×2): qty 1

## 2013-04-14 MED ORDER — OXYCODONE-ACETAMINOPHEN 5-325 MG PO TABS
1.0000 | ORAL_TABLET | ORAL | Status: DC | PRN
  Administered 2013-04-14 (×4): 2 via ORAL
  Administered 2013-04-15: 1 via ORAL
  Administered 2013-04-15: 2 via ORAL
  Filled 2013-04-14 (×2): qty 2
  Filled 2013-04-14 (×2): qty 1
  Filled 2013-04-14 (×2): qty 2
  Filled 2013-04-14: qty 1

## 2013-04-14 MED ORDER — FAMOTIDINE 20 MG PO TABS
40.0000 mg | ORAL_TABLET | Freq: Once | ORAL | Status: AC
  Administered 2013-04-14: 40 mg via ORAL
  Filled 2013-04-14: qty 2

## 2013-04-14 MED ORDER — ONDANSETRON HCL 4 MG PO TABS: 4.0000 mg | ORAL_TABLET | ORAL | Status: DC | PRN

## 2013-04-14 MED ORDER — LIDOCAINE HCL (CARDIAC) 20 MG/ML IV SOLN
INTRAVENOUS | Status: DC | PRN
  Administered 2013-04-14: 30 mg via INTRAVENOUS

## 2013-04-14 MED ORDER — BUPIVACAINE HCL (PF) 0.25 % IJ SOLN
INTRAMUSCULAR | Status: AC
  Filled 2013-04-14: qty 30

## 2013-04-14 MED ORDER — FENTANYL CITRATE 0.05 MG/ML IJ SOLN
INTRAMUSCULAR | Status: DC | PRN
  Administered 2013-04-14 (×2): 50 ug via INTRAVENOUS

## 2013-04-14 MED ORDER — KETOROLAC TROMETHAMINE 30 MG/ML IJ SOLN
INTRAMUSCULAR | Status: DC | PRN
  Administered 2013-04-14: 30 mg via INTRAVENOUS

## 2013-04-14 MED ORDER — METOCLOPRAMIDE HCL 10 MG PO TABS
10.0000 mg | ORAL_TABLET | Freq: Once | ORAL | Status: AC
  Administered 2013-04-14: 10 mg via ORAL
  Filled 2013-04-14: qty 1

## 2013-04-14 MED ORDER — DIPHENHYDRAMINE HCL 25 MG PO CAPS: 25.0000 mg | ORAL_CAPSULE | Freq: Four times a day (QID) | ORAL | Status: DC | PRN

## 2013-04-14 MED ORDER — LIDOCAINE HCL (CARDIAC) 20 MG/ML IV SOLN
INTRAVENOUS | Status: AC
  Filled 2013-04-14: qty 5

## 2013-04-14 MED ORDER — MEPERIDINE HCL 25 MG/ML IJ SOLN: 6.2500 mg | INTRAMUSCULAR | Status: DC | PRN

## 2013-04-14 MED ORDER — ONDANSETRON HCL 4 MG/2ML IJ SOLN
4.0000 mg | INTRAMUSCULAR | Status: DC | PRN
  Administered 2013-04-14: 4 mg via INTRAVENOUS
  Filled 2013-04-14: qty 2

## 2013-04-14 MED ORDER — ONDANSETRON HCL 4 MG/2ML IJ SOLN
INTRAMUSCULAR | Status: AC
Start: 2013-04-14 — End: 2013-04-14
  Filled 2013-04-14: qty 2

## 2013-04-14 MED ORDER — SODIUM BICARBONATE 8.4 % IV SOLN
INTRAVENOUS | Status: DC | PRN
  Administered 2013-04-14 (×5): 5 mL via EPIDURAL

## 2013-04-14 MED ORDER — SIMETHICONE 80 MG PO CHEW: 80.0000 mg | CHEWABLE_TABLET | ORAL | Status: DC | PRN

## 2013-04-14 MED ORDER — WITCH HAZEL-GLYCERIN EX PADS: 1.0000 "application " | MEDICATED_PAD | CUTANEOUS | Status: DC | PRN

## 2013-04-14 MED ORDER — DIBUCAINE 1 % RE OINT: 1.0000 "application " | TOPICAL_OINTMENT | RECTAL | Status: DC | PRN

## 2013-04-14 MED ORDER — METOCLOPRAMIDE HCL 5 MG/ML IJ SOLN: 10.0000 mg | Freq: Once | INTRAMUSCULAR | Status: AC | PRN

## 2013-04-14 SURGICAL SUPPLY — 25 items
BLADE SURG 11 STRL SS (BLADE) ×2 IMPLANT
CHLORAPREP W/TINT 26ML (MISCELLANEOUS) ×2 IMPLANT
CLIP FILSHIE TUBAL LIGA STRL (Clip) ×3 IMPLANT
CLOTH BEACON ORANGE TIMEOUT ST (SAFETY) ×2 IMPLANT
DRSG COVADERM PLUS 2X2 (GAUZE/BANDAGES/DRESSINGS) ×1 IMPLANT
GLOVE BIO SURGEON STRL SZ 6.5 (GLOVE) ×2 IMPLANT
GLOVE BIO SURGEON STRL SZ7 (GLOVE) ×1 IMPLANT
GLOVE BIOGEL M 7.0 STRL (GLOVE) ×1 IMPLANT
GLOVE BIOGEL PI IND STRL 7.0 (GLOVE) ×1 IMPLANT
GLOVE BIOGEL PI INDICATOR 7.0 (GLOVE)
GLOVE ECLIPSE 7.0 STRL STRAW (GLOVE) ×1 IMPLANT
GOWN BRE IMP SLV AUR LG STRL (GOWN DISPOSABLE) ×1 IMPLANT
GOWN PREVENTION PLUS LG XLONG (DISPOSABLE) ×4 IMPLANT
NDL HYPO 25X1 1.5 SAFETY (NEEDLE) ×1 IMPLANT
NEEDLE HYPO 25X1 1.5 SAFETY (NEEDLE) ×2 IMPLANT
NS IRRIG 1000ML POUR BTL (IV SOLUTION) ×2 IMPLANT
PACK ABDOMINAL MINOR (CUSTOM PROCEDURE TRAY) ×2 IMPLANT
SPONGE LAP 4X18 X RAY DECT (DISPOSABLE) IMPLANT
SUT VIC AB 0 CT1 27 (SUTURE) ×2
SUT VIC AB 0 CT1 27XBRD ANBCTR (SUTURE) ×1 IMPLANT
SUT VICRYL 4-0 PS2 18IN ABS (SUTURE) ×2 IMPLANT
SYR CONTROL 10ML LL (SYRINGE) ×2 IMPLANT
TOWEL OR 17X24 6PK STRL BLUE (TOWEL DISPOSABLE) ×4 IMPLANT
TRAY FOLEY BAG SILVER LF 14FR (CATHETERS) ×2 IMPLANT
WATER STERILE IRR 1000ML POUR (IV SOLUTION) ×1 IMPLANT

## 2013-04-14 NOTE — Anesthesia Postprocedure Evaluation (Signed)
Anesthesia Post Note  Patient: [name] [name]  Procedure(s) Performed: * No procedures listed *  Anesthesia type: Epidural  Patient location: Mother/Baby  Post pain: Pain level controlled  Post assessment: Post-op Vital signs reviewed  Last Vitals:  Filed Vitals:   04/14/13 0910  BP: 128/88  Pulse: 82  Temp: 37.1 C  Resp: 18    Post vital signs: Reviewed  Level of consciousness:alert  Complications: No apparent anesthesia complications

## 2013-04-14 NOTE — Transfer of Care (Signed)
Immediate Anesthesia Transfer of Care Note  Patient: [name] [name]  Procedure(s) Performed: Procedure(s): POST PARTUM TUBAL LIGATION (Bilateral)  Patient Location: PACU  Anesthesia Type:Epidural  Level of Consciousness: awake, alert  and oriented  Airway & Oxygen Therapy: Patient Spontanous Breathing  Post-op Assessment: Report given to PACU RN  Post vital signs: Reviewed  Complications: No apparent anesthesia complications

## 2013-04-14 NOTE — Progress Notes (Signed)
Scheduled for pp BTL. The procedure and the risk of anesthesia, bleeding, infection, bowel and bladder injury, failure (1/200) and ectopic pregnancy were discussed and her questions were answered. The procedure will done today. Medicaid consent >30 days  Adam Phenix, MD 04/14/2013 9:24 AM

## 2013-04-14 NOTE — Preoperative (Signed)
Beta Blockers   Reason not to administer Beta Blockers:Not Applicable 

## 2013-04-14 NOTE — Progress Notes (Signed)
I have seen and examined this patient and I agree with the above. Plan for discharge in AM 11/14 as 24 hrs post-delivery will be late this evening. Cam Hai 8:55 AM 04/14/2013

## 2013-04-14 NOTE — Anesthesia Postprocedure Evaluation (Signed)
  Anesthesia Post-op Note  Patient: [name] [name]  Procedure(s) Performed: Procedure(s): POST PARTUM TUBAL LIGATION (Bilateral)  Patient Location: PACU  Anesthesia Type:Epidural  Level of Consciousness: awake, alert  and oriented  Airway and Oxygen Therapy: Patient Spontanous Breathing  Post-op Pain: none  Post-op Assessment: Post-op Vital signs reviewed, Patient's Cardiovascular Status Stable, Respiratory Function Stable, Patent Airway, No signs of Nausea or vomiting, Pain level controlled, No headache and No backache  Post-op Vital Signs: Reviewed and stable  Complications: No apparent anesthesia complications

## 2013-04-14 NOTE — Progress Notes (Signed)
Post Partum Day 1 Subjective: [name] [name] is a 27 y.o. [phone] with past history of eclampsia; she is PPD1 following VBAC. She reports minimal soreness and states sensation has returned fully to her LE post-anesthesia. Pt wishes to bottle feed and is scheduled for ppTL at 0945 today. She plans to have her baby circumcised at [facility] once "someone brings the money over later." Patient denies SOB, CP, Dizziness, Leg Pain / Swelling, but does state her suprapubic area is "sore from pushing."  Objective: Blood pressure 139/87, pulse 87, temperature 98.6 F (37 C), temperature source Oral, resp. rate 16, height 5\' 3"  (1.6 m), weight 76.658 kg (169 lb), last menstrual period 07/24/2012, SpO2 99.00%, unknown if currently breastfeeding. Prot/Cr and CMP ordered 07:00 - not yet resulted.  Physical Exam:  General: alert, cooperative, appears stated age and no distress Lochia: appropriate Uterine Fundus: firm Incision: NA DVT Evaluation: No evidence of DVT seen on physical exam. Negative Homan's sign. No cords or calf tenderness. No significant calf/ankle edema. Dorsalis Pedis intact bilaterally with regular rate and normal contour. Cardiac: Normal S1/S2 present, normal rate; no S3, S4, murmur, or rub. Pulmonary: Lungs clear to auscultation bilaterally.   Recent Labs  04/13/13 1505  HGB 9.4*  HCT 29.8*    Assessment/Plan: Circumcision prior to discharge Bottle Feeding ppTL 09:45 11/13 Discharge once recovered from TL Monitor Labs   LOS: 1 day   Jefm Petty 04/14/2013, 7:48 AM

## 2013-04-14 NOTE — Anesthesia Postprocedure Evaluation (Signed)
  Anesthesia Post-op Note  Patient: [name] [name]  Procedure(s) Performed: Procedure(s): POST PARTUM TUBAL LIGATION (Bilateral)  Patient Location: Mother/Baby  Anesthesia Type:Epidural  Level of Consciousness: awake, alert  and oriented  Airway and Oxygen Therapy: Patient Spontanous Breathing  Post-op Pain: none  Post-op Assessment: Post-op Vital signs reviewed, Patient's Cardiovascular Status Stable, No headache, No backache, No residual numbness and No residual motor weakness  Post-op Vital Signs: Reviewed and stable  Complications: No apparent anesthesia complications

## 2013-04-14 NOTE — Anesthesia Preprocedure Evaluation (Addendum)
Anesthesia Evaluation  Patient identified by MRN, date of birth, ID band Patient awake    Reviewed: Allergy & Precautions, H&P , Patient's Chart, lab work & pertinent test results  Airway Mallampati: II TM Distance: >3 FB Neck ROM: full    Dental no notable dental hx. (+) Teeth Intact   Pulmonary  breath sounds clear to auscultation  Pulmonary exam normal       Cardiovascular hypertension, + CABG Rhythm:regular Rate:Normal  Pre eclampsia in previous pregnancy   Neuro/Psych Seizures -,  negative psych ROS   GI/Hepatic negative GI ROS,   Endo/Other  negative endocrine ROS  Renal/GU negative Renal ROS  negative genitourinary   Musculoskeletal   Abdominal Normal abdominal exam  (+)   Peds  Hematology negative hematology ROS (+)   Anesthesia Other Findings   Reproductive/Obstetrics Desires Post partum BTL                          Anesthesia Physical Anesthesia Plan  ASA: II  Anesthesia Plan: Epidural   Post-op Pain Management:    Induction:   Airway Management Planned: Natural Airway  Additional Equipment:   Intra-op Plan:   Post-operative Plan:   Informed Consent: I have reviewed the patients History and Physical, chart, labs and discussed the procedure including the risks, benefits and alternatives for the proposed anesthesia with the patient or authorized representative who has indicated his/her understanding and acceptance.     Plan Discussed with: Anesthesiologist, CRNA and Surgeon  Anesthesia Plan Comments:         Anesthesia Quick Evaluation

## 2013-04-14 NOTE — Progress Notes (Signed)
CSW attempted to meet with MOB to complete assessment for LPNC, but she was in the OR for a tubal.  CSW to attempt again at a later time. 

## 2013-04-14 NOTE — Op Note (Signed)
[name] [name] 04/13/2013 - 04/14/2013  PREOPERATIVE DIAGNOSIS:  Multiparity, undesired fertility  POSTOPERATIVE DIAGNOSIS:  Multiparity, undesired fertility  PROCEDURE:  Postpartum Bilateral Tubal Sterilization using Filshie Clips   ANESTHESIA:  Epidural  COMPLICATIONS:  None immediate.  ESTIMATED BLOOD LOSS:  Less than 20 ml.  FLUIDS: 500 ml LR.  URINE OUTPUT:  100 ml of clear urine.  INDICATIONS: 27 y.o. [id number]  with undesired fertility,status post vaginal delivery, desires permanent sterilization. Risks and benefits of procedure discussed with patient including permanence of method, bleeding, infection, injury to surrounding organs and need for additional procedures. Risk failure of 0.5-1% with increased risk of ectopic gestation if pregnancy occurs was also discussed with patient.   FINDINGS:  Normal uterus, tubes, and ovaries.  TECHNIQUE:  The patient was taken to the operating room where her epidural anesthesia was dosed up to surgical level and found to be adequate.  She was then placed in the dorsal supine position and prepped and draped in sterile fashion.  After an adequate timeout was performed, attention was turned to the patient's abdomen where a small transverse skin incision was made under the umbilical fold. The incision was taken down to the layer of fascia using the scalpel, and fascia was incised, and extended bilaterally using Mayo scissors. The peritoneum was entered in a sharp fashion. Attention was then turned to the patient's uterus, and left fallopian tube was identified and followed out to the fimbriated end.  A Filshie clip was placed on the left fallopian tube about 4 cm from the cornual attachment, with care given to incorporate the underlying mesosalpinx.  A similar process was carried out on the rightl side allowing for bilateral tubal sterilization.  Good hemostasis was noted overall.  Local analgesia was drizzled on both operative sites.The instruments were  then removed from the patient's abdomen and the fascial incision was repaired with 0 Vicryl, and the skin was closed with a 4-0 Vicryl subcuticular stitch. The patient tolerated the procedure well.  Sponge, lap, and needle counts were correct times two.  The patient was then taken to the recovery room awake and in stable condition.  Adam Phenix, MD 04/14/2013 10:34 AM

## 2013-04-15 MED ORDER — IBUPROFEN 600 MG PO TABS: 600.0000 mg | ORAL_TABLET | Freq: Four times a day (QID) | ORAL | Status: AC

## 2013-04-15 NOTE — Progress Notes (Signed)
Spoke with [name] Lias CNP, about note for patient to return to work.  Patient can go back to work in 2 weeks if she wants.  Note given to patient with this information

## 2013-04-15 NOTE — Clinical Social Work Maternal (Addendum)
LATE ENTRY FROM 04/14/13:  Clinical Social Work Department PSYCHOSOCIAL ASSESSMENT - MATERNAL/CHILD 04/15/2013  Patient:  [name],[name]  Account Number:  [id number]  Admit Date:  04/13/2013  Marjo Bicker Name:   [name] [name]    Clinical Social Worker:  Nobie Putnam, LCSW   Date/Time:  04/14/2013 01:50 PM  Date Referred:  04/14/2013   Referral source  CN     Referred reason  [facility]   Other referral source:    I:  FAMILY / HOME ENVIRONMENT Child's legal guardian:  PARENT  Guardian - Name Guardian - Age Guardian - Address  [name] [name] 27 [address]; [place], [place] [address]  Unknown     Other household support members/support persons Name Relationship DOB  [name] [name] FRIEND    OTHER Roommate's 67 year old daughter   Other support:   [name] [name], Friend    II  PSYCHOSOCIAL DATA Information Source:  Patient Interview  Financial and Community Resources Employment:   Financial resources:  [employer] If Medicaid - County:  [employer] Other  Chemical engineer / Grade:   Maternity Care Coordinator / Child Services Coordination / Early Interventions:  Cultural issues impacting care:    III  STRENGTHS Strengths  Home prepared for Child (including basic supplies)  Supportive family/friends   Strength comment:    IV  RISK FACTORS AND CURRENT PROBLEMS Current Problem:  YES   Risk Factor & Current Problem Patient Issue Family Issue Risk Factor / Current Problem Comment  Other - See comment Y N LPNC @ 31 weeks   N N     V  SOCIAL WORK ASSESSMENT CSW met with pt to assess her reason for [facility] & inquire about possible adoption plan.  Pt was aware of pregnancy in the 1st trimester however did not establish [facility] because she "hates doctors."  Pt denies any illegal substance use & verbalized understanding of hospital drug testing policy after CSW explained.  UDS is negative, meconium results are pending.  This CSW met with pt during her pregnancy to  answer questions regarding adoption.  Pt told CSW that she was thinking about allowing her friend [name] [name]) to care for the baby until she became stable but has since then changed her mind.  She states she not interested in adoption now however plans to give temporary custody to "[name] [name]." Pt explained that she met Mrs. [name] [name] at [facility], while attending an appointment with her daughter.  The pt shared the details of her situation with [name] [name], who agreed to help her raised the baby. Pt has known [name] [name] for about 4 months.  She reports feeling very comfortable with her & her spouse.  According to the pt, they have completed temporary guardianship paperwork, of which [name] [name] has.  Pt has 2 other children who are not in her care.  Her daughter [name] [name] (DOB 12/19/09), lives with her father & [name] [name] (DOB 11/08/11), lives with [name] [name], a family friend.  CPS was involved with pt's last child however pt was allowed to make a plan for her child.  She visits with her son, 2-3 times a week.  She expressed interest on transitioning him back with her once she is stable. Presently, she is living with a friend & her daughter.  Pt spoke openly with this CSW & seems confident with her decision.  Pt has the necessary supplies for the infant & appears to be bonding.  She is  expecting [name] [name] to come visit later this evening.  Since pt has history with CPS & doesn't have custody of her other children, a CPS report was made.  Pt appears understanding & very cooperative.  CSW will continue to monitor drug screen results & inform CPS worker of results.  CSW plans to follow up with pt when [name] [name] arrives with paper work.      VI SOCIAL WORK PLAN Social Work Plan  Child Management consultant Report  Psychosocial Support/Ongoing Assessment of Needs   Type of pt/family education:   If child protective services report - county:  [place] If child protective  services report - date:  04/14/2013 Information/referral to community resources comment:   Other social work plan:

## 2013-04-15 NOTE — Progress Notes (Signed)
CSW met with Erma Heritage & pt this morning & notarized the Temporary Guardianship form, per their request.  Copy of paper work placed in the infants paper chart. CPS case was assigned to [employer], who came & met with the pt & Keshia also.  The CPS worker does not have any concerns about the plan [facility] has arranged & approves of discharge.  Since this is not an adoption, the infant will discharge with pt.  No barriers to discharge.

## 2013-04-15 NOTE — Discharge Summary (Signed)
Obstetric Discharge Summary Reason for Admission: onset of labor Prenatal Procedures: none Intrapartum Procedures: spontaneous vaginal delivery Postpartum Procedures: none Complications-Operative and Postpartum: labial lac, hemostatic Hemoglobin  Date Value Range Status  04/13/2013 9.4* 12.0 - 15.0 g/dL Final     HCT  Date Value Range Status  04/13/2013 29.8* 36.0 - 46.0 % Final    Physical Exam:  General: alert, cooperative and no distress Lochia: appropriate Uterine Fundus: firm at umbilicus Incision: healing well, no significant drainage, no significant erythema DVT Evaluation: No evidence of DVT seen on physical exam. No significant calf/ankle edema.  Discharge Diagnoses: Term Pregnancy-delivered  Discharge Information: Date: 04/15/2013 Activity: unrestricted Diet: routine Medications: Ibuprofen Condition: stable Instructions: refer to practice specific booklet Discharge to: home Follow-up Information   Schedule an appointment as soon as possible for a visit with [facility] (4-6 weeks post partum )    Specialty:  Obstetrics and Gynecology   Contact information:   [address] [place] [address] [phone]      Newborn Data: Live born female  Birth Weight: 6 lb 13 oz (3090 g) APGAR: 8,   Home with mother.  Ms Pariseau is a 27 y.o [phone] who presents at 41w0 for early labor and TOLAC. Pt was started on PCN for GBS prophylaxis. Pt with hx of eclampsia with previous preg but PIH labs within nml and BPs 120s-130s/80s during labor. Pt delivered a viable baby boy via NSVD no complications. EBL approx 250. Pt underwent BTL and tolerated procedure well. Pt bottle feeding. Had isolated diastolic BPs of 107/105. Did not require treatment as self resolved. Pr/cr 0.13. Social work met with pt 2/2 LPNC. No barriers to discharge. Pt will f/up in 4-6 weeks in [facility].  [name] 04/15/2013, 7:59 AM  I have seen and examined this patient and agree the  above assessment. CRESENZO-DISHMAN,Caileen Veracruz 04/15/2013 8:07 AM

## 2013-04-16 ENCOUNTER — Encounter (HOSPITAL_COMMUNITY): Payer: Self-pay | Admitting: Obstetrics & Gynecology

## 2013-05-19 ENCOUNTER — Ambulatory Visit: Payer: Medicaid Other | Admitting: Obstetrics & Gynecology

## 2013-06-14 ENCOUNTER — Encounter: Payer: Self-pay | Admitting: *Deleted

## 2013-06-22 ENCOUNTER — Encounter: Payer: Self-pay | Admitting: *Deleted

## 2014-04-03 ENCOUNTER — Encounter (HOSPITAL_COMMUNITY): Payer: Self-pay | Admitting: Obstetrics & Gynecology

## 2018-04-07 ENCOUNTER — Encounter (HOSPITAL_COMMUNITY): Payer: Self-pay | Admitting: Emergency Medicine

## 2018-04-07 ENCOUNTER — Emergency Department (HOSPITAL_COMMUNITY): Payer: Medicaid Other

## 2018-04-07 ENCOUNTER — Other Ambulatory Visit: Payer: Self-pay

## 2018-04-07 ENCOUNTER — Emergency Department (HOSPITAL_COMMUNITY)
Admission: EM | Admit: 2018-04-07 | Discharge: 2018-04-07 | Disposition: A | Discharge status: Home / Self Care | Payer: Medicaid Other | Attending: Emergency Medicine | Admitting: Emergency Medicine

## 2018-04-07 DIAGNOSIS — S5002XA Contusion of left elbow, initial encounter: Secondary | ICD-10-CM | POA: Insufficient documentation

## 2018-04-07 DIAGNOSIS — Y9389 Activity, other specified: Secondary | ICD-10-CM | POA: Insufficient documentation

## 2018-04-07 DIAGNOSIS — Y999 Unspecified external cause status: Secondary | ICD-10-CM | POA: Insufficient documentation

## 2018-04-07 DIAGNOSIS — S5012XA Contusion of left forearm, initial encounter: Secondary | ICD-10-CM

## 2018-04-07 DIAGNOSIS — Y929 Unspecified place or not applicable: Secondary | ICD-10-CM | POA: Insufficient documentation
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
## 2018-04-07 NOTE — Discharge Instructions (Signed)
Please ice the arm and take Ibuprofen for pain as needed Return if you are worsening

## 2018-04-07 NOTE — ED Notes (Signed)
Pt stable, ambulatory, states understanding of discharge instruction. 
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
## 2023-08-15 ENCOUNTER — Emergency Department
Admission: EM | Admit: 2023-08-15 | Discharge: 2023-08-15 | Disposition: A | Discharge status: Home / Self Care | Attending: Emergency Medicine | Admitting: Emergency Medicine

## 2023-08-15 ENCOUNTER — Other Ambulatory Visit: Payer: Self-pay

## 2023-08-15 DIAGNOSIS — L02411 Cutaneous abscess of right axilla: Secondary | ICD-10-CM | POA: Insufficient documentation

## 2023-08-15 MED ORDER — SULFAMETHOXAZOLE-TRIMETHOPRIM 800-160 MG PO TABS: 1.0000 | ORAL_TABLET | Freq: Two times a day (BID) | ORAL | 0 refills | Status: AC

## 2023-08-15 MED ORDER — LIDOCAINE HCL (PF) 1 % IJ SOLN
5.0000 mL | Freq: Once | INTRAMUSCULAR | Status: AC
  Administered 2023-08-15: 5 mL via INTRADERMAL
  Filled 2023-08-15: qty 5

## 2023-08-15 MED ORDER — OXYCODONE HCL 5 MG PO TABS
5.0000 mg | ORAL_TABLET | Freq: Once | ORAL | Status: AC
  Administered 2023-08-15: 5 mg via ORAL
  Filled 2023-08-15: qty 1

## 2023-08-15 MED ORDER — HYDROCODONE-ACETAMINOPHEN 5-325 MG PO TABS: 1.0000 | ORAL_TABLET | Freq: Four times a day (QID) | ORAL | 0 refills | Status: AC | PRN

## 2023-08-15 MED ORDER — LIDOCAINE-PRILOCAINE 2.5-2.5 % EX CREA
TOPICAL_CREAM | Freq: Once | CUTANEOUS | Status: AC
  Filled 2023-08-15: qty 5

## 2023-08-15 NOTE — ED Provider Notes (Signed)
 [facility] Provider Note    Event Date/Time   First MD Initiated Contact with Patient 08/15/23 1308     (approximate)   History   Abscess   HPI  [name] [name] is a 38 y.o. female with history of seizures and as listed in EMR presents to the emergency department for treatment and evaluation of tenderness/abscess of right axilla. No history of skin infection. No suspected  fever.       Physical Exam   Triage Vital Signs: ED Triage Vitals  Encounter Vitals Group     BP 08/15/23 1308 (!) 155/99     Systolic BP Percentile --      Diastolic BP Percentile --      Pulse Rate 08/15/23 1308 83     Resp 08/15/23 1308 18     Temp 08/15/23 1308 (!) 97.5 F (36.4 C)     Temp Source 08/15/23 1308 Oral     SpO2 08/15/23 1308 100 %     Weight --      Height --      Head Circumference --      Peak Flow --      Pain Score 08/15/23 1309 5     Pain Loc --      Pain Education --      Exclude from Growth Chart --     Most recent vital signs: Vitals:   08/15/23 1308  BP: (!) 155/99  Pulse: 83  Resp: 18  Temp: (!) 97.5 F (36.4 C)  SpO2: 100%    General: Awake, no distress.  CV:  Good peripheral perfusion.  Resp:  Normal effort.  Abd:  No distention.  Other:  Fluctuant area right axilla.   ED Results / Procedures / Treatments   Labs (all labs ordered are listed, but only abnormal results are displayed) Labs Reviewed - No data to display   EKG  Not indicated.   RADIOLOGY  Image and radiology report reviewed and interpreted by me. Radiology report consistent with the same.  Not indicated  PROCEDURES:  Critical Care performed: No  .Incision and Drainage  Date/Time: 08/15/2023 7:11 PM  Performed by: Chinita Pester, FNP Authorized by: Chinita Pester, FNP   Consent:    Consent obtained:  Verbal   Consent given by:  Patient   Risks discussed:  Bleeding, incomplete drainage, pain and infection   Alternatives discussed:   Alternative treatment and observation Universal protocol:    Patient identity confirmed:  Verbally with patient Location:    Type:  Abscess   Location:  Upper extremity (Right axillary area) Pre-procedure details:    Skin preparation:  Povidone-iodine Anesthesia:    Anesthesia method:  Topical application and local infiltration   Topical anesthetic:  EMLA cream   Local anesthetic:  Lidocaine 1% w/o epi Procedure type:    Complexity:  Complex Procedure details:    Incision types:  Single straight   Incision depth:  Dermal   Wound management:  Probed and deloculated   Drainage:  Purulent and bloody   Drainage amount:  Moderate   Packing materials:  1/4 in iodoform gauze Post-procedure details:    Procedure completion:  Tolerated well, no immediate complications    MEDICATIONS ORDERED IN ED:  Medications  lidocaine-prilocaine (EMLA) cream ( Topical Given by Other 08/15/23 1325)  lidocaine (PF) (XYLOCAINE) 1 % injection 5 mL (5 mLs Intradermal Given by Other 08/15/23 1326)  oxyCODONE (Oxy IR/ROXICODONE) immediate release tablet 5 mg (5  mg Oral Given 08/15/23 1325)     IMPRESSION / MDM / ASSESSMENT AND PLAN / ED COURSE   I have reviewed the triage note.  Differential diagnosis includes, but is not limited to, hydradenitis, abscess, cellulitis  Patient's presentation is most consistent with acute illness / injury with system symptoms.  38 year old female presents to the emergency department for treatment and evaluation of pain in right axilla. She noticed a tender area 4 days ago. She has noticed some white drainage but pain has not improved.   ----------------------------------------- 1:32 PM on 08/15/2023 ----------------------------------------- Patient premedicated with EMLA cream and oxycodone prior to procedure. I&D risks and benefits plus alternate options including antibiotic and watchful waiting discussed. Patient agrees to  proceed.  ----------------------------------------- 2:18 PM on 08/15/2023 -----------------------------------------  Incision and drainage performed as described above.  Home care discussed.  Patient will leave the packing in place for 2 days and remove if the drainage has stopped.  If the drainage has not stopped, she is to leave it in place and be reevaluated by primary care, urgent care, or the emergency department.     FINAL CLINICAL IMPRESSION(S) / ED DIAGNOSES   Final diagnoses:  Abscess of axilla, right     Rx / DC Orders   ED Discharge Orders          Ordered    sulfamethoxazole-trimethoprim (BACTRIM DS) 800-160 MG tablet  2 times daily        08/15/23 1417    HYDROcodone-acetaminophen (NORCO/VICODIN) 5-325 MG tablet  Every 6 hours PRN        08/15/23 1417             Note:  This document was prepared using Dragon voice recognition software and may include unintentional dictation errors.   Chinita Pester, FNP 08/15/23 1914    Delton Prairie, MD 08/16/23 [phone]

## 2023-08-15 NOTE — ED Triage Notes (Signed)
 Pt c/o abscess on right axilla that started a week ago, pt noticed some white drainage a few days ago. Pt has no history of boils. Pt is AOXX4, NAD noted. Boil/abscess noted on right armpit, no drainage or redness noted at this time.

## 2023-08-15 NOTE — Discharge Instructions (Signed)
 Remove packing in 2 days if drainage has stopped. Leave in place and follow up with PCP, urgent care, or return to ER if drainage continues for 3 days.

## 2023-08-15 NOTE — ED Notes (Signed)
 Pt ambulatory to waiting room. Pt verbalized understanding of discharge instructions.

## 2023-08-15 NOTE — ED Notes (Addendum)
 Pt here for abscess in right armpit area. Pt A&Ox4. C/o 5/10 pain to the armpit with worsening of pain when spot is touched. Pt ambulatory from triage to rm with steady gait. No issues with movement of arm. Pt in NAD.
